# Patient Record
Sex: Female | Born: 1945 | Race: Black or African American | Hispanic: No | Smoking: Former smoker
Health system: Southern US, Community
[De-identification: ages and names within clinical notes are randomized; demographics above are authoritative.]

## PROBLEM LIST (undated history)

## (undated) DIAGNOSIS — R0602 Shortness of breath: Secondary | ICD-10-CM

## (undated) DIAGNOSIS — N200 Calculus of kidney: Secondary | ICD-10-CM

## (undated) DIAGNOSIS — M1990 Unspecified osteoarthritis, unspecified site: Secondary | ICD-10-CM

## (undated) DIAGNOSIS — I10 Essential (primary) hypertension: Secondary | ICD-10-CM

## (undated) DIAGNOSIS — R000 Tachycardia, unspecified: Secondary | ICD-10-CM

## (undated) DIAGNOSIS — K759 Inflammatory liver disease, unspecified: Secondary | ICD-10-CM

## (undated) DIAGNOSIS — J189 Pneumonia, unspecified organism: Secondary | ICD-10-CM

## (undated) HISTORY — PX: CYST EXCISION: SHX5701

## (undated) HISTORY — PX: TONSILLECTOMY: SUR1361

## (undated) HISTORY — PX: COLONOSCOPY W/ BIOPSIES AND POLYPECTOMY: SHX1376

## (undated) HISTORY — PX: BREAST SURGERY: SHX581

## (undated) HISTORY — PX: JOINT REPLACEMENT: SHX530

## (undated) HISTORY — PX: APPENDECTOMY: SHX54

## (undated) HISTORY — PX: ABDOMINAL HYSTERECTOMY: SHX81

## (undated) HISTORY — PX: FRACTURE SURGERY: SHX138

---

## 2000-05-11 ENCOUNTER — Ambulatory Visit (HOSPITAL_COMMUNITY): Admission: RE | Admit: 2000-05-11 | Discharge: 2000-05-11 | Payer: Self-pay | Admitting: Family Medicine

## 2000-05-11 ENCOUNTER — Encounter: Payer: Self-pay | Admitting: Family Medicine

## 2001-03-29 ENCOUNTER — Ambulatory Visit (HOSPITAL_COMMUNITY): Admission: RE | Admit: 2001-03-29 | Discharge: 2001-03-29 | Payer: Self-pay | Admitting: Family Medicine

## 2002-11-23 ENCOUNTER — Emergency Department (HOSPITAL_COMMUNITY): Admission: EM | Admit: 2002-11-23 | Discharge: 2002-11-23 | Payer: Self-pay | Admitting: Emergency Medicine

## 2012-08-12 ENCOUNTER — Emergency Department (HOSPITAL_BASED_OUTPATIENT_CLINIC_OR_DEPARTMENT_OTHER)
Admission: EM | Admit: 2012-08-12 | Discharge: 2012-08-12 | Disposition: A | Discharge status: Home / Self Care | Payer: Medicare Other | Attending: Emergency Medicine | Admitting: Emergency Medicine

## 2012-08-12 ENCOUNTER — Emergency Department (HOSPITAL_BASED_OUTPATIENT_CLINIC_OR_DEPARTMENT_OTHER): Payer: Medicare Other

## 2012-08-12 ENCOUNTER — Encounter (HOSPITAL_BASED_OUTPATIENT_CLINIC_OR_DEPARTMENT_OTHER): Payer: Self-pay | Admitting: *Deleted

## 2012-08-12 DIAGNOSIS — R0789 Other chest pain: Secondary | ICD-10-CM | POA: Insufficient documentation

## 2012-08-12 DIAGNOSIS — M549 Dorsalgia, unspecified: Secondary | ICD-10-CM | POA: Insufficient documentation

## 2012-08-12 DIAGNOSIS — I10 Essential (primary) hypertension: Secondary | ICD-10-CM | POA: Insufficient documentation

## 2012-08-12 DIAGNOSIS — J029 Acute pharyngitis, unspecified: Secondary | ICD-10-CM | POA: Insufficient documentation

## 2012-08-12 DIAGNOSIS — R51 Headache: Secondary | ICD-10-CM | POA: Insufficient documentation

## 2012-08-12 DIAGNOSIS — Z79899 Other long term (current) drug therapy: Secondary | ICD-10-CM | POA: Insufficient documentation

## 2012-08-12 HISTORY — DX: Essential (primary) hypertension: I10

## 2012-08-12 LAB — COMPREHENSIVE METABOLIC PANEL
AST: 35 U/L (ref 0–37)
Albumin: 3.4 g/dL — ABNORMAL LOW (ref 3.5–5.2)
Alkaline Phosphatase: 157 U/L — ABNORMAL HIGH (ref 39–117)
BUN: 12 mg/dL (ref 6–23)
Chloride: 105 mEq/L (ref 96–112)
Potassium: 3.7 mEq/L (ref 3.5–5.1)
Total Bilirubin: 0.4 mg/dL (ref 0.3–1.2)

## 2012-08-12 LAB — URINALYSIS, ROUTINE W REFLEX MICROSCOPIC
Bilirubin Urine: NEGATIVE
Glucose, UA: NEGATIVE mg/dL
Hgb urine dipstick: NEGATIVE
Ketones, ur: NEGATIVE mg/dL
Protein, ur: NEGATIVE mg/dL

## 2012-08-12 LAB — CBC WITH DIFFERENTIAL/PLATELET
Basophils Absolute: 0 10*3/uL (ref 0.0–0.1)
Basophils Relative: 0 % (ref 0–1)
Hemoglobin: 13.4 g/dL (ref 12.0–15.0)
Lymphocytes Relative: 28 % (ref 12–46)
MCHC: 35.1 g/dL (ref 30.0–36.0)
Monocytes Relative: 7 % (ref 3–12)
Neutro Abs: 3 10*3/uL (ref 1.7–7.7)
Neutrophils Relative %: 62 % (ref 43–77)
WBC: 4.8 10*3/uL (ref 4.0–10.5)

## 2012-08-12 LAB — URINE MICROSCOPIC-ADD ON

## 2012-08-12 MED ORDER — TRAMADOL HCL 50 MG PO TABS: 50.0000 mg | ORAL_TABLET | Freq: Four times a day (QID) | ORAL | Status: DC | PRN

## 2012-08-12 MED ORDER — TRAMADOL HCL 50 MG PO TABS
50.0000 mg | ORAL_TABLET | Freq: Once | ORAL | Status: AC
  Administered 2012-08-12: 50 mg via ORAL
  Filled 2012-08-12: qty 1

## 2012-08-12 MED ORDER — IBUPROFEN 600 MG PO TABS: 600.0000 mg | ORAL_TABLET | Freq: Four times a day (QID) | ORAL | Status: DC | PRN

## 2012-08-12 MED ORDER — IBUPROFEN 400 MG PO TABS
600.0000 mg | ORAL_TABLET | Freq: Once | ORAL | Status: AC
  Administered 2012-08-12: 600 mg via ORAL
  Filled 2012-08-12: qty 1

## 2012-08-12 MED ORDER — IOHEXOL 350 MG/ML SOLN
100.0000 mL | Freq: Once | INTRAVENOUS | Status: AC | PRN
  Administered 2012-08-12: 100 mL via INTRAVENOUS

## 2012-08-12 NOTE — ED Provider Notes (Signed)
History     CSN: [id number]  Arrival date & time 08/12/12  1018   First MD Initiated Contact with Patient 08/12/12 1056      Chief Complaint  Patient presents with  . Headache    (Consider location/radiation/quality/duration/timing/severity/associated sxs/prior treatment) HPI Pt had L sided sharp chest pain starting last night. No radiation. No SOB, cough, lower ext swelling. Pt also c/o generalized HA. No focal weakness, sensory changes, N/V. Pt is poor historian and describes symptoms vaguely  Past Medical History  Diagnosis Date  . Hypertension     Past Surgical History  Procedure Date  . Joint replacement   . Abdominal hysterectomy   . Tonsillectomy     No family history on file.  History  Substance Use Topics  . Smoking status: Never Smoker   . Smokeless tobacco: Not on file  . Alcohol Use: No    OB History    Grav Para Term Preterm Abortions TAB SAB Ect Mult Living                  Review of Systems  Constitutional: Negative for fever and chills.  HENT: Positive for sore throat. Negative for congestion, rhinorrhea, neck pain, voice change and sinus pressure.   Eyes: Negative for photophobia and visual disturbance.  Respiratory: Negative for cough, shortness of breath and wheezing.   Cardiovascular: Positive for chest pain. Negative for palpitations and leg swelling.  Gastrointestinal: Negative for nausea, vomiting, diarrhea and anal bleeding.  Musculoskeletal: Negative for back pain.  Neurological: Positive for headaches. Negative for dizziness, seizures, syncope, weakness, light-headedness and numbness.    Allergies  Review of patient's allergies indicates no known allergies.  Home Medications   Current Outpatient Rx  Name Route Sig Dispense Refill  . ALLOPURINOL 300 MG PO TABS Oral Take 300 mg by mouth daily.    Marland Kitchen DILTIAZEM HCL ER BEADS 240 MG PO CP24 Oral Take 240 mg by mouth daily.    Marland Kitchen LOSARTAN POTASSIUM 100 MG PO TABS Oral Take 100 mg by  mouth daily.    . IBUPROFEN 600 MG PO TABS Oral Take 1 tablet (600 mg total) by mouth every 6 (six) hours as needed for pain. 30 tablet 0  . TRAMADOL HCL 50 MG PO TABS Oral Take 1 tablet (50 mg total) by mouth every 6 (six) hours as needed for pain. 15 tablet 0    BP 169/87  Pulse 69  Temp 98 F (36.7 C) (Oral)  Resp 16  Ht 5\' 6"  (1.676 m)  Wt 275 lb (124.739 kg)  BMI 44.39 kg/m2  SpO2 95%  Physical Exam  Nursing note and vitals reviewed. Constitutional: She is oriented to person, place, and time. She appears well-developed and well-nourished. No distress.  HENT:  Head: Normocephalic and atraumatic.  Mouth/Throat: Oropharynx is clear and moist.  Eyes: EOM are normal. Pupils are equal, round, and reactive to light.  Neck: Normal range of motion. Neck supple.  Cardiovascular: Normal rate and regular rhythm.  Exam reveals no gallop and no friction rub.   No murmur heard. Pulmonary/Chest: Effort normal and breath sounds normal. No respiratory distress. She has no wheezes. She has no rales. She exhibits tenderness (mild L chest wall ttp).  Abdominal: Soft. Bowel sounds are normal. She exhibits no distension and no mass. There is no tenderness. There is no rebound and no guarding.  Musculoskeletal: Normal range of motion. She exhibits no edema and no tenderness.       L calf  swelling. No TTP  Neurological: She is alert and oriented to person, place, and time.       5/5 motor, sensation intact  Skin: Skin is warm and dry. No rash noted. No erythema.  Psychiatric: She has a normal mood and affect. Her behavior is normal.    ED Course  Procedures (including critical care time)  Labs Reviewed  COMPREHENSIVE METABOLIC PANEL - Abnormal; Notable for the following:    Albumin 3.4 (*)     ALT 53 (*)     Alkaline Phosphatase 157 (*)     GFR calc non Af Amer 52 (*)     GFR calc Af Amer 60 (*)     All other components within normal limits  URINALYSIS, ROUTINE W REFLEX MICROSCOPIC -  Abnormal; Notable for the following:    Leukocytes, UA TRACE (*)     All other components within normal limits  D-DIMER, QUANTITATIVE - Abnormal; Notable for the following:    D-Dimer, Quant 0.92 (*)     All other components within normal limits  URINE MICROSCOPIC-ADD ON - Abnormal; Notable for the following:    Squamous Epithelial / LPF FEW (*)     Bacteria, UA FEW (*)     All other components within normal limits  CBC WITH DIFFERENTIAL  TROPONIN I  TROPONIN I   Dg Chest 2 View  08/12/2012  *RADIOLOGY REPORT*  Clinical Data: Left-sided chest pain radiating to the back. Headache and nausea.  Hypertension.  CHEST - 2 VIEW  Comparison: Chest radiograph from [facility] dated 07/12/2011.  Findings: Borderline cardiomegaly noted with mild tortuosity of the thoracic aorta.  Linear scarring noted in both lower lobes and in the left upper lobe. The lungs appear otherwise clear.  Mild thoracic spondylosis noted.  No pleural effusion.  IMPRESSION:  1.  Borderline cardiomegaly. 2.  Tortuous thoracic aorta ( stable). 3.  Linear scarring in the lower lobes and left upper lobe.   Original Report Authenticated By: Dellia Cloud, M.D.    Ct Angio Chest Pe W/cm &/or Wo Cm  08/12/2012  *RADIOLOGY REPORT*  Clinical Data: Midsternal chest pain and pressure.  Shortness of breath.  Elevated D-dimer.  CT ANGIOGRAPHY CHEST  Technique:  Multidetector CT imaging of the chest using the standard protocol during bolus administration of intravenous contrast. Multiplanar reconstructed images including MIPs were obtained and reviewed to evaluate the vascular anatomy.  Contrast: OMNIPAQUE IOHEXOL 350 MG/ML SOLN  Comparison: 08/12/2012  Findings: No filling defect is identified in the pulmonary arterial tree to suggest pulmonary embolus.  No acute thoracic aortic findings.  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  No pathologic thoracic adenopathy.  No reversal of the interventricular septal contour.   Tortuous thoracic aorta noted.  No pleural effusion identified.  Linear scarring noted in the lower lobes, right middle lobe, lingula.  IMPRESSION:  1. No filling defect is identified in the pulmonary arterial tree to suggest pulmonary embolus. 2.  Linear scarring in the lower lobes, right middle lobe, and lingula. 3.  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 4.  Thoracic spondylosis. 5.  Tortuous aorta.   Original Report Authenticated By: Dellia Cloud, M.D.      1. Atypical chest pain   2. Headache      Date: 08/12/2012  Rate:63  Rhythm: normal sinus rhythm  QRS Axis: normal  Intervals: normal  ST/T Wave abnormalities: normal  Conduction Disutrbances:none  Narrative Interpretation:   Old  EKG Reviewed: none available    MDM  Atypical chest pain. Given swelling in L calf will screen for PE.   CT with no cause for pt's symptoms. She is currently asymptomatic. Doubt CAD. I have urged pt to f/u with her pMD and return immediately for any change in symptoms or concerns     Loren Racer, MD 08/12/12 [phone]

## 2012-08-12 NOTE — ED Notes (Signed)
Pt presents to ED today with HA and mild sinus pressure.  Pt also reports some mid chest pressure.  No cough or URI sx.

## 2013-05-14 ENCOUNTER — Other Ambulatory Visit: Payer: Self-pay | Admitting: Orthopaedic Surgery

## 2013-05-14 ENCOUNTER — Other Ambulatory Visit: Payer: Medicare Other

## 2013-05-14 DIAGNOSIS — R609 Edema, unspecified: Secondary | ICD-10-CM

## 2013-05-14 DIAGNOSIS — R52 Pain, unspecified: Secondary | ICD-10-CM

## 2013-05-15 ENCOUNTER — Ambulatory Visit
Admission: RE | Admit: 2013-05-15 | Discharge: 2013-05-15 | Disposition: A | Discharge status: Home / Self Care | Payer: Medicare Other | Source: Ambulatory Visit | Attending: Orthopaedic Surgery | Admitting: Orthopaedic Surgery

## 2013-05-15 DIAGNOSIS — R52 Pain, unspecified: Secondary | ICD-10-CM

## 2013-05-15 DIAGNOSIS — R609 Edema, unspecified: Secondary | ICD-10-CM

## 2013-06-20 ENCOUNTER — Other Ambulatory Visit (HOSPITAL_COMMUNITY): Payer: Self-pay | Admitting: Orthopaedic Surgery

## 2013-07-09 ENCOUNTER — Encounter (HOSPITAL_COMMUNITY): Payer: Self-pay

## 2013-07-11 ENCOUNTER — Encounter (HOSPITAL_COMMUNITY)
Admission: RE | Admit: 2013-07-11 | Discharge: 2013-07-11 | Disposition: A | Discharge status: Home / Self Care | Payer: Medicare Other | Source: Ambulatory Visit | Attending: Orthopaedic Surgery | Admitting: Orthopaedic Surgery

## 2013-07-11 ENCOUNTER — Encounter (HOSPITAL_COMMUNITY): Payer: Self-pay

## 2013-07-11 DIAGNOSIS — Z01818 Encounter for other preprocedural examination: Secondary | ICD-10-CM | POA: Insufficient documentation

## 2013-07-11 DIAGNOSIS — Z01812 Encounter for preprocedural laboratory examination: Secondary | ICD-10-CM | POA: Insufficient documentation

## 2013-07-11 HISTORY — DX: Shortness of breath: R06.02

## 2013-07-11 HISTORY — DX: Pneumonia, unspecified organism: J18.9

## 2013-07-11 HISTORY — DX: Inflammatory liver disease, unspecified: K75.9

## 2013-07-11 HISTORY — DX: Calculus of kidney: N20.0

## 2013-07-11 HISTORY — DX: Tachycardia, unspecified: R00.0

## 2013-07-11 HISTORY — DX: Unspecified osteoarthritis, unspecified site: M19.90

## 2013-07-11 LAB — COMPREHENSIVE METABOLIC PANEL
ALT: 46 U/L — ABNORMAL HIGH (ref 0–35)
Alkaline Phosphatase: 168 U/L — ABNORMAL HIGH (ref 39–117)
BUN: 11 mg/dL (ref 6–23)
Chloride: 104 mEq/L (ref 96–112)
GFR calc Af Amer: 66 mL/min — ABNORMAL LOW (ref >90)
Glucose, Bld: 95 mg/dL (ref 70–99)
Potassium: 3.8 mEq/L (ref 3.5–5.1)
Sodium: 140 mEq/L (ref 135–145)
Total Bilirubin: 0.6 mg/dL (ref 0.3–1.2)
Total Protein: 7.8 g/dL (ref 6.0–8.3)

## 2013-07-11 LAB — URINALYSIS, ROUTINE W REFLEX MICROSCOPIC
Bilirubin Urine: NEGATIVE
Glucose, UA: NEGATIVE mg/dL
Hgb urine dipstick: NEGATIVE
Protein, ur: 30 mg/dL — AB
Specific Gravity, Urine: 1.015 (ref 1.005–1.030)

## 2013-07-11 LAB — URINE MICROSCOPIC-ADD ON

## 2013-07-11 LAB — APTT: aPTT: 31 seconds (ref 24–37)

## 2013-07-11 LAB — CBC
HCT: 42.9 % (ref 36.0–46.0)
Hemoglobin: 15.6 g/dL — ABNORMAL HIGH (ref 12.0–15.0)
MCHC: 36.4 g/dL — ABNORMAL HIGH (ref 30.0–36.0)
RBC: 5.34 MIL/uL — ABNORMAL HIGH (ref 3.87–5.11)
WBC: 4.3 10*3/uL (ref 4.0–10.5)

## 2013-07-11 LAB — PROTIME-INR: Prothrombin Time: 12.7 seconds (ref 11.6–15.2)

## 2013-07-11 LAB — TYPE AND SCREEN
ABO/RH(D): B POS
Antibody Screen: NEGATIVE

## 2013-07-11 LAB — ABO/RH: ABO/RH(D): B POS

## 2013-07-11 NOTE — Progress Notes (Signed)
Pt denies chest pain but states that she gets SOB with exertion. Pt is under the care of Dr. Dalene Carrow (cardiology) and was recently referred to Dr. Bascom Levels ( cardiology) for "high heart rate" both of [facility] Pt states that she has cardiac clearance for surgery; according to pt, "Dr. Ophelia Charter has it." pt denies having a cardiac cath but states that she had an echo at [facility] and a stress test at [facility] at [facility]. Results were requested for both. Pt PCP, Dr. Josiah Lobo at [facility] was notified of pt STOP-BANG Assessment Tool results. Pt chart left for [place], [place] ( anesthesia) to review chest x ray and cardiac history.

## 2013-07-11 NOTE — Progress Notes (Addendum)
Charted in error on wrong pt.

## 2013-07-11 NOTE — Pre-Procedure Instructions (Signed)
Julia Tucker  07/11/2013   Your procedure is scheduled on: Wednesday, July 17, 2013  Report to [facility] at 10:30 AM.  Call this number if you have problems the morning of surgery: [phone]   Remember:   Do not eat food or drink liquids after midnight.   Take these medicines the morning of surgery with A SIP OF WATER: allopurinol (ZYLOPRIM) 300 MG tablet, atenolol (TENORMIN) 50 MG tablet, diltiazem (CARDIZEM SR) 120 MG 12 hr capsule   Stop taking Aspirin and herbal medications. Do not take any NSAIDs ie: Ibuprofen, Advil, Naproxen or any medication containing Aspirin.  Do not wear jewelry, make-up or nail polish.  Do not wear lotions, powders, or perfumes. You may wear deodorant.  Do not shave 48 hours prior to surgery.  Do not bring valuables to the hospital.  [facility] is not responsible for any belongings or valuables.  Contacts, dentures or bridgework may not be worn into surgery.  Leave suitcase in the car. After surgery it may be brought to your room.  For patients admitted to the hospital, checkout time is 11:00 AM the day of discharge.   Patients discharged the day of surgery will not be allowed to drive home.  Name and phone number of your driver:   Special Instructions: Shower using CHG 2 nights before surgery and the night before surgery.  If you shower the day of surgery use CHG.  Use special wash - you have one bottle of CHG for all showers.  You should use approximately 1/3 of the bottle for each shower.   Please read over the following fact sheets that you were given: Pain Booklet, Coughing and Deep Breathing, Blood Transfusion Information, Total Joint Packet, MRSA Information and Surgical Site Infection Prevention

## 2013-07-12 ENCOUNTER — Encounter (HOSPITAL_COMMUNITY): Payer: Self-pay

## 2013-07-12 NOTE — Progress Notes (Signed)
Anesthesia Chart Review:  Patient is a 67 year old female scheduled for left TKA on 07/17/13 by Dr. Ophelia Charter.  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   Patient's primary cardiologist is Dr. Dot Been who felt she was acceptable risk for surgery.  She was also seen by EP cardiologist Dr. Clydie Braun on 06/11/13 for a preoperative evaluation.  She was cleared for surgery with instructions to take diltiazem on the morning of surgery and restart it and atenolol as soon as able post-operatively.    EKG on 06/11/13 ([facility]) showed SB @ 58 bpm, LAE.  Stress echo on 09/25/12 ([facility]) showed normal resting biventricular function (ejection fraction > 50%), with no resting segmental abnormality, no clinical or echocardiographic ischemia (induced all motion abnormality), negative stress echocardiogram.  2V CXR on 08/12/12 showed: 1. Borderline cardiomegaly.  2. Tortuous thoracic aorta ( stable).  3. Linear scarring in the lower lobes and left upper lobe.  She had a 1V CXR on 09/23/12 ([facility]) showed no active disease of the chest.   Preoperative labs noted.  If no acute changes then I would anticipate that she could proceed as planned.  Velna Ochs [facility] Stay Center/Anesthesiology Phone [phone] 07/12/2013 12:38 PM

## 2013-07-16 MED ORDER — DEXTROSE 5 % IV SOLN
3.0000 g | INTRAVENOUS | Status: AC
  Administered 2013-07-17: 3 g via INTRAVENOUS
  Filled 2013-07-16: qty 3000

## 2013-07-17 ENCOUNTER — Encounter (HOSPITAL_COMMUNITY)
Admission: RE | Disposition: A | Discharge status: Home Health | Payer: Self-pay | Source: Ambulatory Visit | Attending: Orthopaedic Surgery

## 2013-07-17 ENCOUNTER — Encounter (HOSPITAL_COMMUNITY): Payer: Self-pay | Admitting: Anesthesiology

## 2013-07-17 ENCOUNTER — Inpatient Hospital Stay (HOSPITAL_COMMUNITY): Payer: Medicare Other | Admitting: Anesthesiology

## 2013-07-17 ENCOUNTER — Encounter (HOSPITAL_COMMUNITY): Payer: Self-pay | Admitting: Vascular Surgery

## 2013-07-17 ENCOUNTER — Inpatient Hospital Stay (HOSPITAL_COMMUNITY)
Admission: RE | Admit: 2013-07-17 | Discharge: 2013-07-20 | DRG: 470 | Disposition: A | Discharge status: Home Health | Payer: Medicare Other | Source: Ambulatory Visit | Attending: Orthopaedic Surgery | Admitting: Orthopaedic Surgery

## 2013-07-17 DIAGNOSIS — N289 Disorder of kidney and ureter, unspecified: Secondary | ICD-10-CM

## 2013-07-17 DIAGNOSIS — Z8249 Family history of ischemic heart disease and other diseases of the circulatory system: Secondary | ICD-10-CM

## 2013-07-17 DIAGNOSIS — Z801 Family history of malignant neoplasm of trachea, bronchus and lung: Secondary | ICD-10-CM

## 2013-07-17 DIAGNOSIS — B1920 Unspecified viral hepatitis C without hepatic coma: Secondary | ICD-10-CM | POA: Diagnosis present

## 2013-07-17 DIAGNOSIS — M1712 Unilateral primary osteoarthritis, left knee: Secondary | ICD-10-CM | POA: Diagnosis present

## 2013-07-17 DIAGNOSIS — M24569 Contracture, unspecified knee: Secondary | ICD-10-CM | POA: Diagnosis present

## 2013-07-17 DIAGNOSIS — Z87891 Personal history of nicotine dependence: Secondary | ICD-10-CM

## 2013-07-17 DIAGNOSIS — Z23 Encounter for immunization: Secondary | ICD-10-CM

## 2013-07-17 DIAGNOSIS — Z9089 Acquired absence of other organs: Secondary | ICD-10-CM

## 2013-07-17 DIAGNOSIS — Z96659 Presence of unspecified artificial knee joint: Secondary | ICD-10-CM

## 2013-07-17 DIAGNOSIS — M1710 Unilateral primary osteoarthritis, unspecified knee: Principal | ICD-10-CM | POA: Diagnosis present

## 2013-07-17 DIAGNOSIS — I10 Essential (primary) hypertension: Secondary | ICD-10-CM | POA: Diagnosis present

## 2013-07-17 DIAGNOSIS — R339 Retention of urine, unspecified: Secondary | ICD-10-CM | POA: Diagnosis not present

## 2013-07-17 HISTORY — PX: KNEE ARTHROPLASTY: SHX992

## 2013-07-17 SURGERY — ARTHROPLASTY, KNEE, TOTAL, USING IMAGELESS COMPUTER-ASSISTED NAVIGATION
Anesthesia: Regional | Site: Knee | Laterality: Left | Wound class: Clean

## 2013-07-17 MED ORDER — MIDAZOLAM HCL 2 MG/2ML IJ SOLN
1.0000 mg | INTRAMUSCULAR | Status: DC | PRN
  Administered 2013-07-17: 2 mg via INTRAVENOUS

## 2013-07-17 MED ORDER — METOCLOPRAMIDE HCL 10 MG PO TABS
5.0000 mg | ORAL_TABLET | Freq: Three times a day (TID) | ORAL | Status: DC | PRN
Start: 2013-07-17 — End: 2013-07-20

## 2013-07-17 MED ORDER — LACTATED RINGERS IV SOLN
INTRAVENOUS | Status: DC | PRN
  Administered 2013-07-17 (×2): via INTRAVENOUS

## 2013-07-17 MED ORDER — OXYCODONE HCL 5 MG PO TABS
5.0000 mg | ORAL_TABLET | ORAL | Status: DC | PRN
  Administered 2013-07-17: 10 mg via ORAL
  Administered 2013-07-19 (×2): 5 mg via ORAL
  Administered 2013-07-19: 10 mg via ORAL
  Filled 2013-07-17 (×2): qty 2
  Filled 2013-07-17 (×2): qty 1
  Filled 2013-07-17: qty 2

## 2013-07-17 MED ORDER — SODIUM CHLORIDE 0.9 % IR SOLN
Status: DC | PRN
  Administered 2013-07-17: 3000 mL

## 2013-07-17 MED ORDER — ATORVASTATIN CALCIUM 10 MG PO TABS
10.0000 mg | ORAL_TABLET | Freq: Every day | ORAL | Status: DC
  Filled 2013-07-17 (×3): qty 1

## 2013-07-17 MED ORDER — HYDROMORPHONE HCL PF 1 MG/ML IJ SOLN
INTRAMUSCULAR | Status: AC
  Filled 2013-07-17: qty 1

## 2013-07-17 MED ORDER — ONDANSETRON HCL 4 MG/2ML IJ SOLN
INTRAMUSCULAR | Status: DC | PRN
  Administered 2013-07-17: 4 mg via INTRAVENOUS

## 2013-07-17 MED ORDER — PHENOL 1.4 % MT LIQD: 1.0000 | OROMUCOSAL | Status: DC | PRN

## 2013-07-17 MED ORDER — HYDROMORPHONE HCL PF 1 MG/ML IJ SOLN
0.2500 mg | INTRAMUSCULAR | Status: DC | PRN
  Administered 2013-07-17 (×2): 0.5 mg via INTRAVENOUS

## 2013-07-17 MED ORDER — OXYCODONE HCL 5 MG/5ML PO SOLN: 5.0000 mg | Freq: Once | ORAL | Status: DC | PRN

## 2013-07-17 MED ORDER — ATENOLOL 25 MG PO TABS
25.0000 mg | ORAL_TABLET | Freq: Every day | ORAL | Status: DC
  Administered 2013-07-19 – 2013-07-20 (×2): 25 mg via ORAL
  Filled 2013-07-17 (×3): qty 1

## 2013-07-17 MED ORDER — FENTANYL CITRATE 0.05 MG/ML IJ SOLN
50.0000 ug | INTRAMUSCULAR | Status: DC | PRN
  Administered 2013-07-17: 100 ug via INTRAVENOUS

## 2013-07-17 MED ORDER — MORPHINE SULFATE 2 MG/ML IJ SOLN: 2.0000 mg | INTRAMUSCULAR | Status: DC | PRN

## 2013-07-17 MED ORDER — FENTANYL CITRATE 0.05 MG/ML IJ SOLN
INTRAMUSCULAR | Status: AC
  Filled 2013-07-17: qty 2

## 2013-07-17 MED ORDER — FENTANYL CITRATE 0.05 MG/ML IJ SOLN: 50.0000 ug | INTRAMUSCULAR | Status: DC | PRN

## 2013-07-17 MED ORDER — ACETAMINOPHEN 325 MG PO TABS
650.0000 mg | ORAL_TABLET | Freq: Four times a day (QID) | ORAL | Status: DC | PRN
  Administered 2013-07-19 (×2): 650 mg via ORAL
  Filled 2013-07-17 (×2): qty 2

## 2013-07-17 MED ORDER — BUPIVACAINE-EPINEPHRINE PF 0.5-1:200000 % IJ SOLN
INTRAMUSCULAR | Status: DC | PRN
  Administered 2013-07-17: 30 mL

## 2013-07-17 MED ORDER — NEOSTIGMINE METHYLSULFATE 1 MG/ML IJ SOLN
INTRAMUSCULAR | Status: DC | PRN
  Administered 2013-07-17: 3 mg via INTRAVENOUS

## 2013-07-17 MED ORDER — ONDANSETRON HCL 4 MG/2ML IJ SOLN
4.0000 mg | Freq: Four times a day (QID) | INTRAMUSCULAR | Status: DC | PRN
  Administered 2013-07-18: 4 mg via INTRAVENOUS
  Filled 2013-07-17: qty 2

## 2013-07-17 MED ORDER — MENTHOL 3 MG MT LOZG: 1.0000 | LOZENGE | OROMUCOSAL | Status: DC | PRN

## 2013-07-17 MED ORDER — METHOCARBAMOL 500 MG PO TABS
500.0000 mg | ORAL_TABLET | Freq: Four times a day (QID) | ORAL | Status: DC | PRN
  Administered 2013-07-19: 500 mg via ORAL
  Filled 2013-07-17: qty 1

## 2013-07-17 MED ORDER — LACTATED RINGERS IV SOLN
INTRAVENOUS | Status: DC
  Administered 2013-07-17: 12:00:00 via INTRAVENOUS

## 2013-07-17 MED ORDER — FLEET ENEMA 7-19 GM/118ML RE ENEM: 1.0000 | ENEMA | Freq: Once | RECTAL | Status: AC | PRN

## 2013-07-17 MED ORDER — FENTANYL CITRATE 0.05 MG/ML IJ SOLN
INTRAMUSCULAR | Status: DC | PRN
  Administered 2013-07-17: 50 ug via INTRAVENOUS
  Administered 2013-07-17: 100 ug via INTRAVENOUS
  Administered 2013-07-17: 25 ug via INTRAVENOUS
  Administered 2013-07-17: 50 ug via INTRAVENOUS
  Administered 2013-07-17: 25 ug via INTRAVENOUS
  Administered 2013-07-17: 100 ug via INTRAVENOUS

## 2013-07-17 MED ORDER — ALLOPURINOL 300 MG PO TABS
300.0000 mg | ORAL_TABLET | Freq: Every day | ORAL | Status: DC
  Administered 2013-07-18 – 2013-07-20 (×3): 300 mg via ORAL
  Filled 2013-07-17 (×3): qty 1

## 2013-07-17 MED ORDER — ROCURONIUM BROMIDE 100 MG/10ML IV SOLN
INTRAVENOUS | Status: DC | PRN
  Administered 2013-07-17: 50 mg via INTRAVENOUS

## 2013-07-17 MED ORDER — METOCLOPRAMIDE HCL 5 MG/ML IJ SOLN: 5.0000 mg | Freq: Three times a day (TID) | INTRAMUSCULAR | Status: DC | PRN

## 2013-07-17 MED ORDER — DOCUSATE SODIUM 100 MG PO CAPS
100.0000 mg | ORAL_CAPSULE | Freq: Two times a day (BID) | ORAL | Status: DC
  Administered 2013-07-17 – 2013-07-20 (×6): 100 mg via ORAL
  Filled 2013-07-17 (×6): qty 1

## 2013-07-17 MED ORDER — LIDOCAINE HCL (CARDIAC) 20 MG/ML IV SOLN
INTRAVENOUS | Status: DC | PRN
  Administered 2013-07-17: 70 mg via INTRAVENOUS

## 2013-07-17 MED ORDER — METHOCARBAMOL 100 MG/ML IJ SOLN
500.0000 mg | Freq: Four times a day (QID) | INTRAVENOUS | Status: DC | PRN
  Filled 2013-07-17: qty 5

## 2013-07-17 MED ORDER — DILTIAZEM HCL ER 60 MG PO CP12
120.0000 mg | ORAL_CAPSULE | Freq: Two times a day (BID) | ORAL | Status: DC
  Administered 2013-07-18 – 2013-07-20 (×4): 120 mg via ORAL
  Filled 2013-07-17 (×7): qty 2

## 2013-07-17 MED ORDER — MIDAZOLAM HCL 2 MG/2ML IJ SOLN
INTRAMUSCULAR | Status: AC
  Filled 2013-07-17: qty 2

## 2013-07-17 MED ORDER — GLYCOPYRROLATE 0.2 MG/ML IJ SOLN
INTRAMUSCULAR | Status: DC | PRN
  Administered 2013-07-17: 0.2 mg via INTRAVENOUS
  Administered 2013-07-17: 0.4 mg via INTRAVENOUS

## 2013-07-17 MED ORDER — SENNOSIDES-DOCUSATE SODIUM 8.6-50 MG PO TABS
1.0000 | ORAL_TABLET | Freq: Every evening | ORAL | Status: DC | PRN
  Administered 2013-07-19: 1 via ORAL
  Filled 2013-07-17: qty 1

## 2013-07-17 MED ORDER — BISACODYL 10 MG RE SUPP: 10.0000 mg | Freq: Every day | RECTAL | Status: DC | PRN

## 2013-07-17 MED ORDER — OXYCODONE-ACETAMINOPHEN 5-325 MG PO TABS: 1.0000 | ORAL_TABLET | ORAL | Status: DC | PRN

## 2013-07-17 MED ORDER — METHOCARBAMOL 500 MG PO TABS: 500.0000 mg | ORAL_TABLET | Freq: Four times a day (QID) | ORAL | Status: DC | PRN

## 2013-07-17 MED ORDER — ONDANSETRON HCL 4 MG PO TABS: 4.0000 mg | ORAL_TABLET | Freq: Four times a day (QID) | ORAL | Status: DC | PRN

## 2013-07-17 MED ORDER — OXYCODONE HCL 5 MG PO TABS: 5.0000 mg | ORAL_TABLET | Freq: Once | ORAL | Status: DC | PRN

## 2013-07-17 MED ORDER — PROPOFOL 10 MG/ML IV BOLUS
INTRAVENOUS | Status: DC | PRN
  Administered 2013-07-17: 160 mg via INTRAVENOUS

## 2013-07-17 MED ORDER — ASPIRIN EC 325 MG PO TBEC
325.0000 mg | DELAYED_RELEASE_TABLET | Freq: Every day | ORAL | Status: DC
  Administered 2013-07-18 – 2013-07-20 (×3): 325 mg via ORAL
  Filled 2013-07-17 (×4): qty 1

## 2013-07-17 MED ORDER — ONDANSETRON HCL 4 MG/2ML IJ SOLN: 4.0000 mg | Freq: Four times a day (QID) | INTRAMUSCULAR | Status: DC | PRN

## 2013-07-17 MED ORDER — KCL IN DEXTROSE-NACL 20-5-0.45 MEQ/L-%-% IV SOLN
INTRAVENOUS | Status: DC
  Administered 2013-07-17: 75 mL/h via INTRAVENOUS
  Filled 2013-07-17 (×3): qty 1000

## 2013-07-17 MED ORDER — FUROSEMIDE 40 MG PO TABS
40.0000 mg | ORAL_TABLET | Freq: Every morning | ORAL | Status: DC
  Administered 2013-07-18 – 2013-07-20 (×3): 40 mg via ORAL
  Filled 2013-07-17 (×3): qty 1

## 2013-07-17 MED ORDER — ACETAMINOPHEN 650 MG RE SUPP: 650.0000 mg | Freq: Four times a day (QID) | RECTAL | Status: DC | PRN

## 2013-07-17 MED ORDER — ASPIRIN EC 325 MG PO TBEC: 325.0000 mg | DELAYED_RELEASE_TABLET | Freq: Every day | ORAL | Status: DC

## 2013-07-17 MED ORDER — ZOLPIDEM TARTRATE 5 MG PO TABS: 5.0000 mg | ORAL_TABLET | Freq: Every evening | ORAL | Status: DC | PRN

## 2013-07-17 MED ORDER — LOSARTAN POTASSIUM 50 MG PO TABS
100.0000 mg | ORAL_TABLET | Freq: Every day | ORAL | Status: DC
  Administered 2013-07-18 – 2013-07-20 (×3): 100 mg via ORAL
  Filled 2013-07-17 (×4): qty 2

## 2013-07-17 MED ORDER — MIDAZOLAM HCL 2 MG/2ML IJ SOLN: 1.0000 mg | INTRAMUSCULAR | Status: DC | PRN

## 2013-07-17 MED ORDER — KETOROLAC TROMETHAMINE 15 MG/ML IJ SOLN
15.0000 mg | Freq: Three times a day (TID) | INTRAMUSCULAR | Status: AC
  Administered 2013-07-17 – 2013-07-18 (×3): 15 mg via INTRAVENOUS
  Filled 2013-07-17 (×3): qty 1

## 2013-07-17 SURGICAL SUPPLY — 65 items
BANDAGE ELASTIC 4 VELCRO ST LF (GAUZE/BANDAGES/DRESSINGS) ×2 IMPLANT
BANDAGE ELASTIC 6 VELCRO ST LF (GAUZE/BANDAGES/DRESSINGS) ×2 IMPLANT
BANDAGE ESMARK 6X9 LF (GAUZE/BANDAGES/DRESSINGS) ×1 IMPLANT
BENZOIN TINCTURE PRP APPL 2/3 (GAUZE/BANDAGES/DRESSINGS) ×2 IMPLANT
BLADE SAGITTAL 25.0X1.19X90 (BLADE) ×2 IMPLANT
BLADE SAW SGTL 13X75X1.27 (BLADE) ×2 IMPLANT
BNDG ELASTIC 6X10 VLCR STRL LF (GAUZE/BANDAGES/DRESSINGS) ×2 IMPLANT
BNDG ESMARK 6X9 LF (GAUZE/BANDAGES/DRESSINGS) ×2
BOWL SMART MIX CTS (DISPOSABLE) ×2 IMPLANT
CAPT RP KNEE ×2 IMPLANT
CEMENT HV SMART SET (Cement) ×4 IMPLANT
CLOSURE STERI-STRIP 1/4X4 (GAUZE/BANDAGES/DRESSINGS) ×2 IMPLANT
CLOTH BEACON ORANGE TIMEOUT ST (SAFETY) ×2 IMPLANT
COVER BACK TABLE 24X17X13 BIG (DRAPES) IMPLANT
COVER SURGICAL LIGHT HANDLE (MISCELLANEOUS) ×2 IMPLANT
CUFF TOURNIQUET SINGLE 34IN LL (TOURNIQUET CUFF) ×2 IMPLANT
CUFF TOURNIQUET SINGLE 44IN (TOURNIQUET CUFF) IMPLANT
DRAPE ORTHO SPLIT 77X108 STRL (DRAPES) ×2
DRAPE SURG ORHT 6 SPLT 77X108 (DRAPES) ×2 IMPLANT
DRAPE U-SHAPE 47X51 STRL (DRAPES) ×2 IMPLANT
DRSG PAD ABDOMINAL 8X10 ST (GAUZE/BANDAGES/DRESSINGS) ×2 IMPLANT
DURAPREP 26ML APPLICATOR (WOUND CARE) ×2 IMPLANT
ELECT REM PT RETURN 9FT ADLT (ELECTROSURGICAL) ×2
ELECTRODE REM PT RTRN 9FT ADLT (ELECTROSURGICAL) ×1 IMPLANT
FACESHIELD LNG OPTICON STERILE (SAFETY) ×2 IMPLANT
GAUZE XEROFORM 5X9 LF (GAUZE/BANDAGES/DRESSINGS) ×2 IMPLANT
GLOVE BIOGEL PI IND STRL 7.5 (GLOVE) ×1 IMPLANT
GLOVE BIOGEL PI IND STRL 8 (GLOVE) ×1 IMPLANT
GLOVE BIOGEL PI INDICATOR 7.5 (GLOVE) ×1
GLOVE BIOGEL PI INDICATOR 8 (GLOVE) ×1
GLOVE ECLIPSE 7.0 STRL STRAW (GLOVE) ×2 IMPLANT
GLOVE ORTHO TXT STRL SZ7.5 (GLOVE) ×2 IMPLANT
GOWN PREVENTION PLUS LG XLONG (DISPOSABLE) IMPLANT
GOWN PREVENTION PLUS XLARGE (GOWN DISPOSABLE) ×2 IMPLANT
GOWN STRL NON-REIN LRG LVL3 (GOWN DISPOSABLE) ×6 IMPLANT
HANDPIECE INTERPULSE COAX TIP (DISPOSABLE) ×1
IMMOBILIZER KNEE 22 UNIV (SOFTGOODS) ×2 IMPLANT
KIT BASIN OR (CUSTOM PROCEDURE TRAY) ×2 IMPLANT
KIT ROOM TURNOVER OR (KITS) ×2 IMPLANT
MANIFOLD NEPTUNE II (INSTRUMENTS) ×2 IMPLANT
MARKER SPHERE PSV REFLC THRD 5 (MARKER) ×6 IMPLANT
NEEDLE 1/2 CIR MAYO (NEEDLE) ×2 IMPLANT
NEEDLE HYPO 25GX1X1/2 BEV (NEEDLE) ×2 IMPLANT
NS IRRIG 1000ML POUR BTL (IV SOLUTION) ×2 IMPLANT
PACK TOTAL JOINT (CUSTOM PROCEDURE TRAY) ×2 IMPLANT
PAD ARMBOARD 7.5X6 YLW CONV (MISCELLANEOUS) ×4 IMPLANT
PAD CAST 4YDX4 CTTN HI CHSV (CAST SUPPLIES) ×1 IMPLANT
PADDING CAST COTTON 4X4 STRL (CAST SUPPLIES) ×1
PADDING CAST COTTON 6X4 STRL (CAST SUPPLIES) ×2 IMPLANT
PIN SCHANZ 4MM 130MM (PIN) ×8 IMPLANT
SET HNDPC FAN SPRY TIP SCT (DISPOSABLE) ×1 IMPLANT
SPONGE GAUZE 4X4 12PLY (GAUZE/BANDAGES/DRESSINGS) ×2 IMPLANT
STAPLER VISISTAT 35W (STAPLE) ×2 IMPLANT
STRIP CLOSURE SKIN 1/2X4 (GAUZE/BANDAGES/DRESSINGS) ×4 IMPLANT
SUCTION FRAZIER TIP 10 FR DISP (SUCTIONS) ×2 IMPLANT
SUT ETHIBOND NAB CT1 #1 30IN (SUTURE) ×2 IMPLANT
SUT VIC AB 2-0 CT1 27 (SUTURE) ×2
SUT VIC AB 2-0 CT1 TAPERPNT 27 (SUTURE) ×2 IMPLANT
SUT VICRYL 4-0 PS2 18IN ABS (SUTURE) ×2 IMPLANT
SUT VICRYL AB 2 0 TIES (SUTURE) ×2 IMPLANT
SYR CONTROL 10ML LL (SYRINGE) ×2 IMPLANT
TOWEL OR 17X24 6PK STRL BLUE (TOWEL DISPOSABLE) ×2 IMPLANT
TOWEL OR 17X26 10 PK STRL BLUE (TOWEL DISPOSABLE) ×2 IMPLANT
TRAY FOLEY CATH 16FRSI W/METER (SET/KITS/TRAYS/PACK) ×2 IMPLANT
WATER STERILE IRR 1000ML POUR (IV SOLUTION) ×6 IMPLANT

## 2013-07-17 NOTE — Preoperative (Signed)
Beta Blockers   Reason not to administer Beta Blockers:Not Applicable 

## 2013-07-17 NOTE — Op Note (Signed)
Julia Tucker, Julia Tucker               ACCOUNT NO.:  [id number]  MEDICAL RECORD NO.:  [id number]  LOCATION:  [facility]                         FACILITY:  [facility]  PHYSICIAN:  Mark C. Ophelia Charter, M.D.    DATE OF BIRTH:  06/08/1946  DATE OF PROCEDURE:  07/17/2013 DATE OF DISCHARGE:                              OPERATIVE REPORT   PREOPERATIVE DIAGNOSIS:  Left knee osteoarthritis, tricompartmental grade 4 changes.  POSTOPERATIVE DIAGNOSIS:  Left knee osteoarthritis, tricompartmental grade 4 changes.  PROCEDURE:  Left cemented total knee arthroplasty, computer assist DePuy #4 femur, LCS with lugs, 10 mm rotating platform, #4 tibia cemented, and 41 mm 3 peg polyethylene patellar component.  SURGEON:  Mark C. Ophelia Charter, MD  ASSISTANT:  Wende Neighbors, PA-C, medically necessary and prepped and present for the entire procedure.  ANESTHESIA:  GOT plus preoperative femoral nerve block.  DRAINS:  None.  TOURNIQUET TIME:  117 minutes.  INDICATIONS:  This 67 year old female, 267 pounds, 121 kilos has had progressive knee osteoarthritis with 2 cm first throughout her knee, bone-on-bone changes, and progressive knee flexion deformity and varus deformity with 20 degree knee flexion contracture and 12 degrees varus.  PROCEDURE IN DETAIL:  After induction of general anesthesia with preoperative femoral nerve block, proximal thigh tourniquet was applied over stockinette and usual 15 drape heel bump, lateral post was applied and prepping with DuraPrep, the usual total knee split sheets, drapes, and impervious stockinette, Coban, sterile skin marker, and Betadine, Steri-Drape large x2 used to seal the skin.  Time-out procedure was completed.  Leg was wrapped in Esmarch, tourniquet was inflated to 350 which gave partial good control with some continued oozing throughout the case due to the patient's large leg, body habitus.  Computer pins were inserted inside the incision on the femur and stab incision  made mid tibia.  With some difficulty, femoral head was modulated and tibia and femoral modules were made.  This confirmed the flexion contracture and varus deformity as expected.  10 mm were taken off the distal femur and 9 off the tibia.  A 1-2 cm spurs had to be resected off of the femur and tibia.  Box cut in the femur was made after tibial AP resection for confirmation and placement of the keel.  There was some still medial tightness 2 mm more than lateral and additional spurs had to be removed off of the posterior and medial aspect of the tibial plateau and 3/4 osteotome was used posteriorly to remove some 2 cm osteophytes off the back of the femur.  This gave improvement with good balance in both flexion, extension.  Knee reached full extension.  There was correction of the varus deformity.  Pulsatile lavage was used.  The cement was vacuum mixed.  The tibia cemented first followed by femur, placed with a 10 mm rotating platform and 41 mm cemented polyethylene button.  The cement was hardened 15 minutes.  Tourniquet was deflated.  Hemostasis obtained in standard layered closure, nonabsorbable deep layer where the quad tendon had been split between the medial one-third and lateral two- thirds.  A 2-0 Vicryl in the subcutaneous tissue, stab incisions in the tibia for pin for computer navigation.  A 2-0 on the subcu tissue and 4- 0 subcuticular skin closure.  Tincture of benzoin, Steri-Strips application.  Instrument count and needle count was correct.  The patient tolerated the procedure well and transferred to recovery room in stable condition.  Postoperative dressing and knee immobilizer was applied.  Instrument count and needle count was correct.     Mark C. Ophelia Charter, M.D.     MCY/MEDQ  D:  07/17/2013  T:  07/17/2013  Job:  [id number]

## 2013-07-17 NOTE — Anesthesia Postprocedure Evaluation (Signed)
  Anesthesia Post-op Note  Patient: Julia Tucker  Procedure(s) Performed: Procedure(s) with comments: COMPUTER ASSISTED TOTAL KNEE ARTHROPLASTY (Left) - Left Total Knee Arthroplasty, Cemented, Computer Assist  Patient Location: PACU  Anesthesia Type:General  Level of Consciousness: awake  Airway and Oxygen Therapy: Patient Spontanous Breathing  Post-op Pain: mild  Post-op Assessment: Post-op Vital signs reviewed  Post-op Vital Signs: stable  Complications: No apparent anesthesia complications

## 2013-07-17 NOTE — Anesthesia Preprocedure Evaluation (Addendum)
Anesthesia Evaluation  Patient identified by MRN, date of birth, ID band Patient awake    Reviewed: Allergy & Precautions, H&P , NPO status , Patient's Chart, lab work & pertinent test results, reviewed documented beta blocker date and time   History of Anesthesia Complications Negative for: history of anesthetic complications  Airway Mallampati: II TM Distance: >3 FB Neck ROM: full    Dental  (+) Edentulous Upper and Dental Advisory Given   Pulmonary shortness of breath,  breath sounds clear to auscultation        Cardiovascular hypertension, Pt. on medications and Pt. on home beta blockers + dysrhythmias Supra Ventricular Tachycardia Rhythm:Regular Rate:Normal     Neuro/Psych negative neurological ROS     GI/Hepatic (+) Hepatitis -, C  Endo/Other  Morbid obesity  Renal/GU      Musculoskeletal  (+) Arthritis -,   Abdominal   Peds  Hematology negative hematology ROS (+)   Anesthesia Other Findings   Reproductive/Obstetrics negative OB ROS                         Anesthesia Physical Anesthesia Plan  ASA: III  Anesthesia Plan: General and Regional   Post-op Pain Management: MAC Combined w/ Regional for Post-op pain   Induction: Intravenous  Airway Management Planned: Oral ETT  Additional Equipment:   Intra-op Plan:   Post-operative Plan: Extubation in OR  Informed Consent: I have reviewed the patients History and Physical, chart, labs and discussed the procedure including the risks, benefits and alternatives for the proposed anesthesia with the patient or authorized representative who has indicated his/her understanding and acceptance.     Plan Discussed with: CRNA, Anesthesiologist and Surgeon  Anesthesia Plan Comments:         Anesthesia Quick Evaluation

## 2013-07-17 NOTE — Interval H&P Note (Signed)
History and Physical Interval Note:  07/17/2013 12:33 PM  Julia Tucker  has presented today for surgery, with the diagnosis of Left Knee Osteoarthritis  The various methods of treatment have been discussed with the patient and family. After consideration of risks, benefits and other options for treatment, the patient has consented to  Procedure(s) with comments: COMPUTER ASSISTED TOTAL KNEE ARTHROPLASTY (Left) - Left Total Knee Arthroplasty, Cemented, Computer Assist as a surgical intervention .  The patient's history has been reviewed, patient examined, no change in status, stable for surgery.  I have reviewed the patient's chart and labs.  Questions were answered to the patient's satisfaction.     YATES,MARK C

## 2013-07-17 NOTE — Brief Op Note (Cosign Needed)
07/17/2013  3:34 PM  PATIENT:  Julia Tucker  67 y.o. female  PRE-OPERATIVE DIAGNOSIS:  Left Knee Osteoarthritis  POST-OPERATIVE DIAGNOSIS:  Left Knee Osteoarthritis  PROCEDURE:  Procedure(s) with comments: COMPUTER ASSISTED TOTAL KNEE ARTHROPLASTY (Left) - Left Total Knee Arthroplasty, Cemented, Computer Assist  SURGEON:  Surgeon(s) and Role:    * Eldred Manges, MD - Primary  PHYSICIAN ASSISTANT: Maud Deed [facility]  ASSISTANTS: none   ANESTHESIA:   general  EBL:  Total I/O In: 1500 [I.V.:1500] Out: 450 [Urine:200; Blood:250]  BLOOD ADMINISTERED:none  DRAINS: none   LOCAL MEDICATIONS USED:  NONE  SPECIMEN:  No Specimen  DISPOSITION OF SPECIMEN:  N/A  COUNTS:  YES  TOURNIQUET:   Total Tourniquet Time Documented: Thigh (Left) - 77 minutes Total: Thigh (Left) - 77 minutes   DICTATION: .Note written in EPIC  PLAN OF CARE: Admit to inpatient   PATIENT DISPOSITION:  PACU - hemodynamically stable.   Delay start of Pharmacological VTE agent (>24hrs) due to surgical blood loss or risk of bleeding: yes

## 2013-07-17 NOTE — Anesthesia Procedure Notes (Addendum)
Anesthesia Regional Block:  Femoral nerve block  Pre-Anesthetic Checklist: ,, timeout performed, Correct Patient, Correct Site, Correct Laterality, Correct Procedure,, site marked, risks and benefits discussed, Surgical consent,  Pre-op evaluation,  At surgeon's request and post-op pain management  Laterality: Left  Prep: chloraprep       Needles:  Injection technique: Single-shot  Needle Type: Echogenic Stimulator Needle     Needle Length: 9cm  Needle Gauge: 21 and 21 G    Additional Needles:  Procedures: nerve stimulator Femoral nerve block  Nerve Stimulator or Paresthesia:  Response: Quadriceps muscle contraction, 0.45 mA,   Additional Responses:   Narrative:  Start time: 07/17/2013 12:11 PM End time: 07/17/2013 12:19 PM Injection made incrementally with aspirations every 5 mL.  Performed by: Personally  Anesthesiologist: Dr Chaney Malling  Additional Notes: Functioning IV was confirmed and monitors were applied.  A 90mm 21ga Arrow echogenic stimulator needle was used. Sterile prep and drape,hand hygiene and sterile gloves were used.  Negative aspiration and negative test dose prior to incremental administration of local anesthetic. The patient tolerated the procedure well.    Femoral nerve block Procedure Name: Intubation Date/Time: 07/17/2013 12:50 PM Performed by: Romie Minus K Pre-anesthesia Checklist: Patient identified, Emergency Drugs available, Suction available, Patient being monitored and Timeout performed Patient Re-evaluated:Patient Re-evaluated prior to inductionOxygen Delivery Method: Circle system utilized Preoxygenation: Pre-oxygenation with 100% oxygen Intubation Type: IV induction Ventilation: Mask ventilation without difficulty Laryngoscope Size: Miller and 2 Grade View: Grade I Tube type: Oral Tube size: 7.5 mm Number of attempts: 1 Airway Equipment and Method: Stylet Placement Confirmation: ETT inserted through vocal cords under direct vision,   positive ETCO2,  CO2 detector and breath sounds checked- equal and bilateral Secured at: 21 cm Tube secured with: Tape Dental Injury: Teeth and Oropharynx as per pre-operative assessment  Comments: Smooth IV induction. EZ mask, DL x 1 MAC 4. Estimated Grade 3 view. Esophageal intubation by Aldean Jewett Paramedic student. Recognized immediately. DL x 1 by [employer]. Grade 1 view. AOI. BBS=. +ETCO2.

## 2013-07-17 NOTE — Transfer of Care (Signed)
Immediate Anesthesia Transfer of Care Note  Patient: Julia Tucker  Procedure(s) Performed: Procedure(s) with comments: COMPUTER ASSISTED TOTAL KNEE ARTHROPLASTY (Left) - Left Total Knee Arthroplasty, Cemented, Computer Assist  Patient Location: PACU  Anesthesia Type:General  Level of Consciousness: awake, alert  and oriented  Airway & Oxygen Therapy: Patient Spontanous Breathing and Patient connected to nasal cannula oxygen  Post-op Assessment: Report given to PACU RN and Post -op Vital signs reviewed and stable  Post vital signs: Reviewed and stable  Complications: No apparent anesthesia complications

## 2013-07-17 NOTE — H&P (Signed)
TOTAL KNEE ADMISSION H&P  Patient is being admitted for left total knee arthroplasty.  Subjective:  Chief Complaint:left knee pain.  HPI: Julia Tucker, 67 y.o. female, has a history of pain and functional disability in the left knee due to arthritis and has failed non-surgical conservative treatments for greater than 12 weeks to includeNSAID's and/or analgesics, corticosteriod injections, use of assistive devices and activity modification.  Onset of symptoms was gradual, starting 2 years ago with gradually worsening course since that time. The patient noted no past surgery on the left knee(s).  Patient currently rates pain in the left knee(s) at 7 out of 10 with activity. Patient has night pain, worsening of pain with activity and weight bearing and pain that interferes with activities of daily living.  Patient has evidence of subchondral sclerosis, periarticular osteophytes and joint space narrowing by imaging studies. This patient has had successful right total knee replacement.. There is no active infection.  There are no active problems to display for this patient.  Past Medical History  Diagnosis Date  . Hypertension   . Shortness of breath     Hx: of with exertion  . Tachycardia     Hx: of PSVT - Dr. Cathlean Cower and Dr. Clydie [facility])  . Pneumonia   . Kidney stones     Hx: of  . Arthritis   . Hepatitis     Hx: of Hep C    Past Surgical History  Procedure Laterality Date  . Joint replacement    . Abdominal hysterectomy    . Tonsillectomy    . Appendectomy    . Colonoscopy w/ biopsies and polypectomy      Hx; of  . Breast surgery    . Fracture surgery      plate and 12 screws in Right ankle  . Cyst excision      Hx: of on chin    No prescriptions prior to admission   No Known Allergies  History  Substance Use Topics  . Smoking status: Former Games developer  . Smokeless tobacco: Never Used     Comment: quit 18-20 years ago  . Alcohol Use: No     Family History  Problem Relation Age of Onset  . Hypertension Mother   . Cancer - Lung Father   . Cancer - Other Sister      Review of Systems  Musculoskeletal: Positive for joint pain.       Left knee pain.  Right TKR doing well  All other systems reviewed and are negative.    Objective:  Physical Exam  Constitutional: She is oriented to person, place, and time. She appears well-developed and well-nourished.  HENT:  Head: Normocephalic and atraumatic.  Eyes: EOM are normal. Pupils are equal, round, and reactive to light.  Neck: Normal range of motion.  Cardiovascular: Normal rate and regular rhythm.   Respiratory: Effort normal and breath sounds normal.  GI: Soft.  Musculoskeletal:   the left knee shows +2 effusion today.  She has range of motion lacking 10 degrees of full extension and flexes to 110 degrees.  No ligamentous instability with valgus or varus stress.  She has distal pitting edema 1+ bilateral lower extremities.  Dorsalis pedis pulses +2 bilaterally.    Neurological: She is alert and oriented to person, place, and time.  Skin: Skin is warm and dry.  Psychiatric: She has a normal mood and affect.    Vital signs in last 24 hours:    Labs:  Estimated body mass index is 44.41 kg/(m^2) as calculated from the following:   Height as of 08/12/12: 5\' 6"  (1.676 m).   Weight as of 08/12/12: 124.739 kg (275 lb).   Imaging Review Plain radiographs demonstrate severe degenerative joint disease of the left knee(s). The overall alignment isneutral. The bone quality appears to be adequate for age and reported activity level.  Assessment/Plan:  End stage arthritis, left knee   The patient history, physical examination, clinical judgment of the provider and imaging studies are consistent with end stage degenerative joint disease of the left knee(s) and total knee arthroplasty is deemed medically necessary. The treatment options including medical management, injection  therapy arthroscopy and arthroplasty were discussed at length. The risks and benefits of total knee arthroplasty were presented and reviewed. The risks due to aseptic loosening, infection, stiffness, patella tracking problems, thromboembolic complications and other imponderables were discussed. The patient acknowledged the explanation, agreed to proceed with the plan and consent was signed. Patient is being admitted for inpatient treatment for surgery, pain control, PT, OT, prophylactic antibiotics, VTE prophylaxis, progressive ambulation and ADL's and discharge planning. The patient is planning to be discharged home with home health services

## 2013-07-17 NOTE — Progress Notes (Signed)
Orthopedic Tech Progress Note Patient Details:  Julia Tucker [birth year] [id number]  CPM Left Knee CPM Left Knee: On Left Knee Flexion (Degrees): 60 Left Knee Extension (Degrees): 0 Additional Comments: foot roll   Shawnie Pons 07/17/2013, 4:23 PM

## 2013-07-18 LAB — CBC
MCV: 81.2 fL (ref 78.0–100.0)
Platelets: 219 10*3/uL (ref 150–400)
RBC: 3.83 MIL/uL — ABNORMAL LOW (ref 3.87–5.11)
WBC: 6.5 10*3/uL (ref 4.0–10.5)

## 2013-07-18 LAB — BASIC METABOLIC PANEL
CO2: 26 mEq/L (ref 19–32)
Chloride: 103 mEq/L (ref 96–112)
Creatinine, Ser: 1.58 mg/dL — ABNORMAL HIGH (ref 0.50–1.10)
Potassium: 4.4 mEq/L (ref 3.5–5.1)
Sodium: 137 mEq/L (ref 135–145)

## 2013-07-18 MED ORDER — DEXTROSE-NACL 5-0.45 % IV SOLN
INTRAVENOUS | Status: DC
  Administered 2013-07-18 – 2013-07-19 (×3): via INTRAVENOUS

## 2013-07-18 NOTE — Progress Notes (Signed)
UR COMPLETED  

## 2013-07-18 NOTE — Progress Notes (Signed)
Bladder scan performed on patient. Amount showed only 75cc. Will continue to monitor for two more hours for spontaneous void.

## 2013-07-18 NOTE — Progress Notes (Signed)
Patient unable to void spontaneously after foley removal. She complains of no discomfort and does not have any fullness or the urge the void. Will bladder scan and if >435ml will perform in and out cath.

## 2013-07-18 NOTE — Evaluation (Signed)
Occupational Therapy Evaluation Patient Details Name: Julia Tucker MRN: [id number] DOB: 1946-11-04 Today's Date: 07/18/2013 Time: 4098-1191 OT Time Calculation (min): 29 min  OT Assessment / Plan / Recommendation History of present illness s/p elective Lt TKA   Clinical Impression   Pt demos decline in function with ADLs and ADL mobility safety and would benefit from acute OT services to address impairments to help restore PLOF to return home safely    OT Assessment  Patient needs continued OT Services    Follow Up Recommendations  Home health OT;Supervision/Assistance - 24 hour    Barriers to Discharge   Noe  Equipment Recommendations  Tub/shower bench;3 in 1 bedside comode    Recommendations for Other Services    Frequency  Min 2X/week    Precautions / Restrictions Precautions Precautions: Knee;Fall Required Braces or Orthoses: Knee Immobilizer - Left Knee Immobilizer - Left: Discontinue once straight leg raise with < 10 degree lag Restrictions LLE Weight Bearing: Weight bearing as tolerated   Pertinent Vitals/Pain 4/10    ADL  Grooming: Performed;Wash/dry hands;Wash/dry face;Min guard Where Assessed - Grooming: Supported standing Upper Body Bathing: Simulated;Set up;Supervision/safety Lower Body Bathing: Simulated;Moderate assistance Upper Body Dressing: Performed;Supervision/safety;Set up Lower Body Dressing: Performed;Moderate assistance;Maximal assistance Toilet Transfer: Performed;Min guard;Minimal assistance Toilet Transfer Method: Sit to stand Toilet Transfer Equipment: Raised toilet seat with arms (or 3-in-1 over toilet);Grab bars Toileting - Clothing Manipulation and Hygiene: Performed;Moderate assistance Where Assessed - Toileting Clothing Manipulation and Hygiene: Standing Tub/Shower Transfer Method: Not assessed Equipment Used: Rolling walker;Knee Immobilizer;Gait belt;Other (comment);Sock aid;Reacher;Long-handled sponge;Long-handled shoe horn (3 in  1) Transfers/Ambulation Related to ADLs: cues for sequencing and correct hand placement ADL Comments: pt provided with education and demo of ADL A/E    OT Diagnosis: Generalized weakness;Acute pain  OT Problem List: Pain;Decreased activity tolerance;Decreased knowledge of use of DME or AE;Impaired balance (sitting and/or standing) OT Treatment Interventions:     OT Goals(Current goals can be found in the care plan section) Acute Rehab OT Goals Patient Stated Goal: to go home  OT Goal Formulation: With patient Time For Goal Achievement: 07/25/13 Potential to Achieve Goals: Good ADL Goals Pt Will Perform Grooming: with supervision;with set-up;standing Pt Will Perform Lower Body Bathing: with mod assist;with min assist;with adaptive equipment Pt Will Perform Lower Body Dressing: with mod assist;with adaptive equipment Pt Will Transfer to Toilet: with min guard assist;with supervision;grab bars;ambulating (with 3 in 1 over toilet) Pt Will Perform Toileting - Clothing Manipulation and hygiene: with min guard assist;sitting/lateral leans;sit to/from stand Pt Will Perform Tub/Shower Transfer: with min assist;with min guard assist;tub bench  Visit Information  Last OT Received On: 07/18/13 Assistance Needed: +1 History of Present Illness: s/p elective Lt TKA       Prior Functioning     Home Living Family/patient expects to be discharged to:: Private residence Living Arrangements: Children Available Help at Discharge: Available PRN/intermittently;Family Type of Home: House Home Access: Stairs to enter Secretary/administrator of Steps: 3 Entrance Stairs-Rails: Can reach both Home Layout: One level Prior Function Level of Independence: Independent Comments: was amb with SPC "at end" when pain was increased Communication Communication: No difficulties Dominant Hand: Right         Vision/Perception Vision - History Baseline Vision: Wears glasses all the time Patient Visual  Report: No change from baseline   Cognition  Cognition Arousal/Alertness: Awake/alert Behavior During Therapy: WFL for tasks assessed/performed Overall Cognitive Status: Within Functional Limits for tasks assessed    Extremity/Trunk Assessment Upper Extremity Assessment Upper  Extremity Assessment: Overall WFL for tasks assessed     Mobility Bed Mobility Bed Mobility: Sit to Supine;Scooting to HOB Sit to Supine: 4: Min assist Scooting to [facility]: 5: Supervision;With rail Details for Bed Mobility Assistance: min A with L LE onto bed Transfers Sit to Stand: 4: Min guard;From chair/3-in-1;With armrests Stand to Sit: 4: Min guard;To chair/3-in-1;With armrests;To bed Details for Transfer Assistance: cues for hand placement and sequencing; pt requries incr time due to nausea; relies heavily on armrests for sit to stand      Exercise     Balance Balance Balance Assessed: Yes Dynamic Standing Balance Dynamic Standing - Balance Support: Left upper extremity supported;No upper extremity supported;During functional activity Dynamic Standing - Level of Assistance: 4: Min assist;5: Stand by assistance   End of Session OT - End of Session Equipment Utilized During Treatment: Gait belt;Rolling walker (3 in 1, ADL A/E) Activity Tolerance: Patient tolerated treatment well Patient left: in bed;with call bell/phone within reach CPM Left Knee CPM Left Knee: Off  GO     Margaretmary Eddy [facility] 07/18/2013, 1:29 PM

## 2013-07-18 NOTE — Progress Notes (Signed)
07/18/13 Spoke with patient about HHC, she chose [facility] from [facility] agencies list. Dava Najjar with [facility] and set up HHPT and HHOT. [employer] will provide 3N1 prior to d/c. Jacquelynn Cree RN, BSN, CCM

## 2013-07-18 NOTE — Progress Notes (Signed)
Physical Therapy Treatment Patient Details Name: Julia Tucker MRN: [id number] DOB: [birth year] Today's Date: 07/18/2013 Time: 4696-2952 PT Time Calculation (min): 25 min  PT Assessment / Plan / Recommendation  History of Present Illness s/p elective Lt TKA   PT Comments   Pt slowly progressing with therapy. Was able to incr amb distance and theraex this session. Continues to c/o nausea with activity. Cont per POC.  Follow Up Recommendations  Home health PT;Supervision/Assistance - 24 hour     Does the patient have the potential to tolerate intense rehabilitation     Barriers to Discharge        Equipment Recommendations  3in1 (PT)    Recommendations for Other Services    Frequency 7X/week   Progress towards PT Goals Progress towards PT goals: Progressing toward goals  Plan Current plan remains appropriate    Precautions / Restrictions Precautions Precautions: Knee;Fall Required Braces or Orthoses: Knee Immobilizer - Left Knee Immobilizer - Left: Discontinue once straight leg raise with < 10 degree lag Restrictions Weight Bearing Restrictions: Yes LLE Weight Bearing: Weight bearing as tolerated   Pertinent Vitals/Pain 2/10; pt premedicated     Mobility  Bed Mobility Bed Mobility: Supine to Sit;Sitting - Scoot to Edge of Bed;Sit to Supine Supine to Sit: 5: Supervision;HOB flat Sitting - Scoot to Edge of Bed: 6: Modified independent (Device/Increase time) Sit to Supine: 4: Min assist;HOB flat Scooting to [facility]: 5: Supervision;With rail Details for Bed Mobility Assistance: HOB flattened to simulate home enviroment and handrails removed; pt able to transfer supine to sit with supervision but requires min (A) to advance Lt LE onto bed with cues; requires incr time to complete tasks   Transfers Transfers: Sit to Stand;Stand to Sit Sit to Stand: 5: Supervision;From bed;From chair/3-in-1;With armrests Stand to Sit: To chair/3-in-1;With armrests;To bed;5: Supervision Details  for Transfer Assistance: cues for hand placement and safety; requires increased time; no physical (A) required  Ambulation/Gait Ambulation/Gait Assistance: 4: Min guard Ambulation Distance (Feet): 50 Feet Assistive device: Rolling walker Ambulation/Gait Assistance Details: cues for gt sequencing; encouraged to equalize step lengths; pt slowly progressing to step through gt; has difficulty incr step length Gait Pattern: Decreased stance time - left;Decreased step length - right;Antalgic;Trunk flexed Gait velocity: decr  General Gait Details: pt with continued fwd flex posture  Stairs: No Wheelchair Mobility Wheelchair Mobility: No    Exercises Total Joint Exercises Ankle Circles/Pumps: AROM;10 reps;Both;Supine Heel Slides: AAROM;Left;10 reps;Supine Hip ABduction/ADduction: AAROM;Left;10 reps;Supine Goniometric ROM: AROM Lt knee flex to 60 degrees    PT Diagnosis:    PT Problem List:   PT Treatment Interventions:     PT Goals (current goals can now be found in the care plan section) Acute Rehab PT Goals Patient Stated Goal: to go home  PT Goal Formulation: With patient Time For Goal Achievement: 07/25/13 Potential to Achieve Goals: Good  Visit Information  Last PT Received On: 07/18/13 Assistance Needed: +1 History of Present Illness: s/p elective Lt TKA    Subjective Data  Subjective: Pt lying supine; stated "i just got in bed but we can go. Im just hoping i can pee"  Patient Stated Goal: to go home    Cognition  Cognition Arousal/Alertness: Awake/alert Behavior During Therapy: WFL for tasks assessed/performed Overall Cognitive Status: Within Functional Limits for tasks assessed    Balance  Balance Balance Assessed: No Dynamic Standing Balance Dynamic Standing - Balance Support: Left upper extremity supported;No upper extremity supported;During functional activity Dynamic Standing - Level of Assistance:  4: Min assist;5: Stand by assistance  End of Session PT - End of  Session Equipment Utilized During Treatment: Gait belt;Left knee immobilizer Activity Tolerance: Patient tolerated treatment well Patient left: in bed;with call bell/phone within reach Nurse Communication: Mobility status CPM Left Knee CPM Left Knee: [phone]   GP     Donnamarie Poag [place], [place] [phone] 07/18/2013, 2:51 PM

## 2013-07-18 NOTE — Progress Notes (Signed)
Orthopedic Tech Progress Note Patient Details:  Julia Tucker 17-Jul-1946 [id number] On cpm at 8:00 pm LLE 0-65 Patient ID: Julia Tucker, female   DOB: [birth year], 67 y.o.   MRN: [id number]   Julia Tucker 07/18/2013, 7:57 PM

## 2013-07-18 NOTE — Progress Notes (Signed)
Patient still unable to void spontaneously and complaining of some discomfort and the urge to void. Bladder scan performed and found over in the bladder. Will in and out cath as she has not voided in 8 hours and is complaining of discomfort per protocol.

## 2013-07-18 NOTE — Progress Notes (Signed)
Subjective: 1 Day Post-Op Procedure(s) (LRB): COMPUTER ASSISTED TOTAL KNEE ARTHROPLASTY (Left) Patient reports pain as 1 on 0-10 scale.    Objective: Vital signs in last 24 hours: Temp:  [97.1 F (36.2 C)-98.2 F (36.8 C)] 98.2 F (36.8 C) (09/11 0547) Pulse Rate:  [47-60] 58 (09/11 0547) Resp:  [9-20] 16 (09/11 0547) BP: (111-177)/(58-87) 111/58 mmHg (09/11 0547) SpO2:  [95 %-100 %] 98 % (09/11 0547)  Intake/Output from previous day: 09/10 0701 - 09/11 0700 In: 2260 [P.O.:360; I.V.:1900] Out: 1350 [Urine:1100; Blood:250] Intake/Output this shift:     Recent Labs  07/18/13 0538  HGB 10.8*    Recent Labs  07/18/13 0538  WBC 6.5  RBC 3.83*  HCT 31.1*  PLT 219    Recent Labs  07/18/13 0538  NA 137  K 4.4  CL 103  CO2 26  BUN 17  CREATININE 1.58*  GLUCOSE 138*  CALCIUM 8.2*   No results found for this basename: LABPT, INR,  in the last 72 hours  Neurologically intact  Assessment/Plan: 1 Day Post-Op Procedure(s) (LRB): COMPUTER ASSISTED TOTAL KNEE ARTHROPLASTY (Left) Up with therapy, creatinine 1.58 has increased , will leave IV , , remove KCl   Recheck in AM  Ardith Test C 07/18/2013, 8:05 AM

## 2013-07-18 NOTE — Evaluation (Signed)
Physical Therapy Evaluation Patient Details Name: Julia Tucker MRN: [id number] DOB: 07-14-1946 Today's Date: 07/18/2013 Time: 1478-2956 PT Time Calculation (min): 31 min  PT Assessment / Plan / Recommendation History of Present Illness  s/p elective Lt TKA  Clinical Impression  Pt is s/p Lt TKA POD#1 resulting in the deficits listed below (see PT Problem List).  Pt greatly limited in therapy this morning due to incr nausea. Pt required increased time for all transfers and mobility due to nausea. RN notified and medicated pt. Pt highly motivated to D/C home and refusing rehab at this time.Pt will benefit from skilled PT to increase their independence and safety with mobility to allow discharge to the venue listed below.     PT Assessment  Patient needs continued PT services    Follow Up Recommendations  Home health PT;Supervision/Assistance - 24 hour    Does the patient have the potential to tolerate intense rehabilitation      Barriers to Discharge   pt reports she can have friends/family check in on her while son is out     Equipment Recommendations  3in1 (PT)    Recommendations for Other Services     Frequency 7X/week    Precautions / Restrictions Precautions Precautions: Knee;Fall Precaution Comments: given  TKA HEP handout' Required Braces or Orthoses: Knee Immobilizer - Left Knee Immobilizer - Left: Discontinue once straight leg raise with < 10 degree lag Restrictions Weight Bearing Restrictions: Yes LLE Weight Bearing: Weight bearing as tolerated   Pertinent Vitals/Pain "no pain" pt premedicated       Mobility  Bed Mobility Bed Mobility: Supine to Sit;Sitting - Scoot to Edge of Bed Supine to Sit: 5: Supervision;HOB elevated;With rails Sitting - Scoot to Edge of Bed: 5: Supervision Details for Bed Mobility Assistance: min cues for sequencing; no physical (A) required  Transfers Transfers: Sit to Stand;Stand to Sit Sit to Stand: 4: Min guard;From bed;From  chair/3-in-1;With armrests Stand to Sit: 4: Min guard;To chair/3-in-1;With armrests Details for Transfer Assistance: cues for hand placement and sequencing; pt requries incr time due to nausea; relies heavily on armrests for sit to stand  (min guard for safety ) Ambulation/Gait Ambulation/Gait Assistance: 4: Min guard;4: Min assist Ambulation Distance (Feet): 10 Feet (5, rest, 5 feet ) Assistive device: Rolling walker Ambulation/Gait Assistance Details: cues for gt sequencing and RW management; Lt LE was buckling; pt requries (A) to maintain balance and for safety; limited by nausea; requires 1 sitting rest break to reach bathroom  Gait Pattern: Decreased stance time - left;Decreased step length - right;Antalgic;Trunk flexed Gait velocity: decr due to nausea  General Gait Details: pt with fwd flex posture and anterior lean on RW: mod cues to correct for safety  (pt req increased time for gt and transfers due to nausea ) Stairs: No Wheelchair Mobility Wheelchair Mobility: No    Exercises Total Joint Exercises Ankle Circles/Pumps: Both;10 reps;AROM;Seated   PT Diagnosis: Difficulty walking;Acute pain  PT Problem List: Decreased strength;Decreased range of motion;Decreased activity tolerance;Decreased mobility;Decreased balance;Decreased knowledge of use of DME;Decreased safety awareness;Pain PT Treatment Interventions: DME instruction;Gait training;Stair training;Functional mobility training;Therapeutic activities;Therapeutic exercise;Balance training;Neuromuscular re-education;Patient/family education     PT Goals(Current goals can be found in the care plan section) Acute Rehab PT Goals Patient Stated Goal: to go home  PT Goal Formulation: With patient Time For Goal Achievement: 07/25/13 Potential to Achieve Goals: Good  Visit Information  Last PT Received On: 07/18/13 Assistance Needed: +1 History of Present Illness: s/p elective Lt TKA  Prior Functioning  Home  Living Family/patient expects to be discharged to:: Private residence Living Arrangements: Children Available Help at Discharge: Available PRN/intermittently;Family Type of Home: House Home Access: Stairs to enter [employer] of Steps: 3 Entrance Stairs-Rails: Can reach both Home Layout: One level Home Equipment: Cane - single point;Walker - 2 wheels Additional Comments: pt has tub shower; standard height toilet seat Prior Function Level of Independence: Independent Comments: was amb with SPC "at end" when pain was increased Communication Communication: No difficulties Dominant Hand: Right    Cognition  Cognition Arousal/Alertness: Awake/alert Behavior During Therapy: WFL for tasks assessed/performed Overall Cognitive Status: Within Functional Limits for tasks assessed    Extremity/Trunk Assessment Upper Extremity Assessment Upper Extremity Assessment: Defer to OT evaluation Lower Extremity Assessment Lower Extremity Assessment: LLE deficits/detail LLE: Unable to fully assess due to pain Cervical / Trunk Assessment Cervical / Trunk Assessment: Normal   Balance Balance Balance Assessed: Yes Static Sitting Balance Static Sitting - Balance Support: Bilateral upper extremity supported;Feet unsupported Static Sitting - Level of Assistance: 5: Stand by assistance Static Sitting - Comment/# of Minutes: pt tolerated sitting ~5 min to take deep breaths and control nausea   End of Session PT - End of Session Equipment Utilized During Treatment: Gait belt;Left knee immobilizer Activity Tolerance: Other (comment) (nausea ) Patient left: in chair;with call bell/phone within reach Nurse Communication: Mobility status;Other (comment) (pt c/o nausea ) CPM Left Knee CPM Left Knee: Off Additional Comments: foot roll (refused foot roll)  GP     Donell Sievert, [place] [phone] 07/18/2013, 9:17 AM

## 2013-07-19 ENCOUNTER — Encounter (HOSPITAL_COMMUNITY): Payer: Self-pay | Admitting: Orthopaedic Surgery

## 2013-07-19 DIAGNOSIS — N289 Disorder of kidney and ureter, unspecified: Secondary | ICD-10-CM

## 2013-07-19 LAB — CBC
HCT: 25.2 % — ABNORMAL LOW (ref 36.0–46.0)
Hemoglobin: 8.9 g/dL — ABNORMAL LOW (ref 12.0–15.0)
MCV: 79.7 fL (ref 78.0–100.0)
RBC: 3.16 MIL/uL — ABNORMAL LOW (ref 3.87–5.11)
WBC: 6.9 10*3/uL (ref 4.0–10.5)

## 2013-07-19 LAB — COMPREHENSIVE METABOLIC PANEL
AST: 40 U/L — ABNORMAL HIGH (ref 0–37)
CO2: 24 mEq/L (ref 19–32)
Calcium: 8.3 mg/dL — ABNORMAL LOW (ref 8.4–10.5)
Chloride: 99 mEq/L (ref 96–112)
Creatinine, Ser: 1.53 mg/dL — ABNORMAL HIGH (ref 0.50–1.10)
GFR calc Af Amer: 40 mL/min — ABNORMAL LOW (ref >90)
GFR calc non Af Amer: 34 mL/min — ABNORMAL LOW (ref >90)
Glucose, Bld: 110 mg/dL — ABNORMAL HIGH (ref 70–99)
Total Bilirubin: 0.8 mg/dL (ref 0.3–1.2)

## 2013-07-19 LAB — BASIC METABOLIC PANEL
BUN: 21 mg/dL (ref 6–23)
CO2: 25 mEq/L (ref 19–32)
Chloride: 100 mEq/L (ref 96–112)
Creatinine, Ser: 1.64 mg/dL — ABNORMAL HIGH (ref 0.50–1.10)
GFR calc Af Amer: 37 mL/min — ABNORMAL LOW (ref >90)
Glucose, Bld: 104 mg/dL — ABNORMAL HIGH (ref 70–99)
Potassium: 3.6 mEq/L (ref 3.5–5.1)

## 2013-07-19 NOTE — Progress Notes (Signed)
Physical Therapy Treatment Patient Details Name: Julia Tucker MRN: [id number] DOB: [birth year] Today's Date: 07/19/2013 Time: 8657-8469 PT Time Calculation (min): 24 min  PT Assessment / Plan / Recommendation  History of Present Illness s/p elective Lt TKA   PT Comments   Pt progressing well with mobility today. Able to incr theraex and amb distance with supervision (A). Pt reports her daughter will be here later today, will plan on addressing stair amb goal with family present. Possible D/C from mobility standpoint today if pt is safe to amb steps.   Follow Up Recommendations  Home health PT;Supervision/Assistance - 24 hour     Does the patient have the potential to tolerate intense rehabilitation     Barriers to Discharge        Equipment Recommendations  3in1 (PT)    Recommendations for Other Services    Frequency 7X/week   Progress towards PT Goals Progress towards PT goals: Progressing toward goals  Plan Current plan remains appropriate    Precautions / Restrictions Precautions Precautions: Knee;Fall Required Braces or Orthoses: Knee Immobilizer - Left Restrictions Weight Bearing Restrictions: Yes LLE Weight Bearing: Weight bearing as tolerated   Pertinent Vitals/Pain 7/10; pt premedicated     Mobility  Bed Mobility Bed Mobility: Supine to Sit;Sitting - Scoot to Edge of Bed Supine to Sit: 5: Supervision;HOB flat Sitting - Scoot to Edge of Bed: 6: Modified independent (Device/Increase time) Details for Bed Mobility Assistance: pt demo good technique to come to long sit then swing Lt LE off EOB; min cues for sequencing; no physical (A) needed Transfers Transfers: Sit to Stand;Stand to Sit Sit to Stand: From bed;5: Supervision Stand to Sit: 5: Supervision;To chair/3-in-1;With armrests Details for Transfer Assistance: pt demo good technique; supervision for min cues for safety; no physical (A) needed  Ambulation/Gait Ambulation/Gait Assistance: 5:  Supervision Ambulation Distance (Feet): 200 Feet Assistive device: Rolling walker Ambulation/Gait Assistance Details: pt able to progress to step through gt with cues and demo; pt demo SOB at end of amb but was conversing; no buckling of Lt LE this session  Gait Pattern: Step-through pattern;Trunk flexed Gait velocity: decr  General Gait Details: min cues for upright posture  Stairs: No Wheelchair Mobility Wheelchair Mobility: No    Exercises Total Joint Exercises Ankle Circles/Pumps: AROM;10 reps;Both;Supine Quad Sets: AROM;Left;10 reps;Seated Heel Slides: AROM;Left;10 reps;Seated Long Arc Quad: AROM;Left;10 reps;Seated Knee Flexion: AAROM;Left;10 reps;Seated Goniometric ROM: AROM Lt knee flex 70 degrees in sitting    PT Diagnosis:    PT Problem List:   PT Treatment Interventions:     PT Goals (current goals can now be found in the care plan section) Acute Rehab PT Goals Patient Stated Goal: to go home  PT Goal Formulation: With patient Time For Goal Achievement: 07/25/13 Potential to Achieve Goals: Good  Visit Information  Last PT Received On: 07/19/13 Assistance Needed: +1 History of Present Illness: s/p elective Lt TKA    Subjective Data  Subjective: Pt lying supine; agreeable to therapy. "i think i laid around too much yesterday, im more sore today"  Patient Stated Goal: to go home    Cognition  Cognition Arousal/Alertness: Awake/alert Behavior During Therapy: WFL for tasks assessed/performed Overall Cognitive Status: Within Functional Limits for tasks assessed    Balance  Balance Balance Assessed: No  End of Session PT - End of Session Equipment Utilized During Treatment: Gait belt;Left knee immobilizer Activity Tolerance: Patient tolerated treatment well   GP     Shelva Majestic [place], [place] [phone]  07/19/2013, 9:09 AM

## 2013-07-19 NOTE — Progress Notes (Signed)
Physical Therapy Treatment Patient Details Name: Julia Tucker MRN: [id number] DOB: 07-15-1946 Today's Date: 07/19/2013 Time: 7829-5621 PT Time Calculation (min): 24 min  PT Assessment / Plan / Recommendation  History of Present Illness s/p elective Lt TKA   PT Comments   Pt progressing very well with therapy. Was able to amb steps with daughter present for family education. Pt would benefit from practicing steps in next session prior to D/C home. Pt amb at supervision level. Cont per POC till pt is medically stable for D/C.   Follow Up Recommendations  Home health PT;Supervision/Assistance - 24 hour     Does the patient have the potential to tolerate intense rehabilitation     Barriers to Discharge        Equipment Recommendations  3in1 (PT)    Recommendations for Other Services    Frequency 7X/week   Progress towards PT Goals Progress towards PT goals: Progressing toward goals  Plan Current plan remains appropriate    Precautions / Restrictions Precautions Precautions: Knee Required Braces or Orthoses: Knee Immobilizer - Left Knee Immobilizer - Left:  (pt demo good stability without KI) Restrictions Weight Bearing Restrictions: Yes LLE Weight Bearing: Weight bearing as tolerated   Pertinent Vitals/Pain 7/10; pt premedicated     Mobility  Bed Mobility Bed Mobility: Supine to Sit;Sitting - Scoot to Edge of Bed;Sit to Supine Supine to Sit: 6: Modified independent (Device/Increase time);HOB flat Sitting - Scoot to Edge of Bed: 6: Modified independent (Device/Increase time) Sit to Supine: 5: Supervision;HOB flat Details for Bed Mobility Assistance: supervision for sit to supine only for cues for technique; pt demo good technqiue for supine to sit  Transfers Transfers: Sit to Stand;Stand to Sit Sit to Stand: 5: Supervision;From bed Stand to Sit: 5: Supervision;To chair/3-in-1;With armrests Details for Transfer Assistance: min cues for hand placement and  technique Ambulation/Gait Ambulation/Gait Assistance: 5: Supervision Ambulation Distance (Feet): 500 Feet Assistive device: Rolling walker Ambulation/Gait Assistance Details: min cues for gt sequencing; with cues pt was able to progress and continuously amb with step through gt; cues for upright posture and RW management; pt did not demo any buckling of Lt LE and was able to amb without KI Gait Pattern: Step-through pattern;Trunk flexed Gait velocity: decr  General Gait Details: min cues for upright posture  Stairs: Yes Stairs Assistance: 5: Supervision;4: Min guard Stairs Assistance Details (indicate cue type and reason): initially pt was min guard; progressed to supervision (A) with cues for technique and hand placement; pt prferred amb up sideways using Lt handrail going up  Stair Management Technique: One rail Left;Sideways;Step to pattern Number of Stairs: 7 Wheelchair Mobility Wheelchair Mobility: No    Exercises Total Joint Exercises Ankle Circles/Pumps: AROM;10 reps;Both;Supine Quad Sets: AROM;Left;10 reps;Supine Heel Slides: AROM;Left;10 reps;Seated Hip ABduction/ADduction: AAROM;Left;10 reps;Seated Long Arc Quad: AROM;Left;10 reps;Seated Knee Flexion: AAROM;Left;10 reps;Seated Goniometric ROM: AROM Lt knee flex 70 degrees in sitting    PT Diagnosis:    PT Problem List:   PT Treatment Interventions:     PT Goals (current goals can now be found in the care plan section) Acute Rehab PT Goals Patient Stated Goal: home tomorrow PT Goal Formulation: With patient Time For Goal Achievement: 07/25/13 Potential to Achieve Goals: Good  Visit Information  Last PT Received On: 07/19/13 Assistance Needed: +1 History of Present Illness: s/p elective Lt TKA    Subjective Data  Subjective: pt lying in bed; daughter present; "im not going home today because of my kindneys but id like to  practice steps with her here" Patient Stated Goal: home tomorrow   Cognition   Cognition Arousal/Alertness: Awake/alert Behavior During Therapy: WFL for tasks assessed/performed Overall Cognitive Status: Within Functional Limits for tasks assessed    Balance  Balance Balance Assessed: No  End of Session PT - End of Session Equipment Utilized During Treatment: Gait belt Activity Tolerance: Patient tolerated treatment well Patient left: in bed;with call bell/phone within reach;with family/visitor present Nurse Communication: Mobility status CPM Left Knee CPM Left Knee: [address]   GP     Donnamarie Poag [place], [place] [phone] 07/19/2013, 12:52 PM

## 2013-07-19 NOTE — Progress Notes (Signed)
Subjective: 2 Days Post-Op Procedure(s) (LRB): COMPUTER ASSISTED TOTAL KNEE ARTHROPLASTY (Left) Patient reports pain as mild.  Urinary retention yesterday, but began voiding last night and has been voiding today. Making more urine and drinking more today.  IV still at 75cc/hr Walking in hallway.  Objective: Vital signs in last 24 hours: Temp:  [97.9 F (36.6 C)-101.1 F (38.4 C)] 98.5 F (36.9 C) (09/12 1300) Pulse Rate:  [56-76] 65 (09/12 1300) Resp:  [16] 16 (09/12 1300) BP: (115-141)/(53-59) 118/53 mmHg (09/12 1300) SpO2:  [93 %-97 %] 93 % (09/12 1300)  Intake/Output from previous day: 09/11 0701 - 09/12 0700 In: 1211.3 [P.O.:720; I.V.:491.3] Out: 200 [Urine:200] Intake/Output this shift:     Recent Labs  07/18/13 0538 07/19/13 0755  HGB 10.8* 8.9*    Recent Labs  07/18/13 0538 07/19/13 0755  WBC 6.5 6.9  RBC 3.83* 3.16*  HCT 31.1* 25.2*  PLT 219 182    Recent Labs  07/18/13 0538 07/19/13 0755  NA 137 133*  K 4.4 3.6  CL 103 100  CO2 26 25  BUN 17 21  CREATININE 1.58* 1.64*  GLUCOSE 138* 104*  CALCIUM 8.2* 8.0*   No results found for this basename: LABPT, INR,  in the last 72 hours  Neurovascular intact Sensation intact distally Intact pulses distally Dorsiflexion/Plantar flexion intact Incision: no drainage  Assessment/Plan: 2 Days Post-Op Procedure(s) (LRB): COMPUTER ASSISTED TOTAL KNEE ARTHROPLASTY (Left) Up with therapy Cr continues to slowly elevate.  Now at 1.64.  Producing more urine now.  Will need to monitor Cr for another day and discharge to home in am if values stabilizing. Continue IVF and check labs in am rx on chart.  Sitara Cashwell M 07/19/2013, 1:33 PM

## 2013-07-19 NOTE — Progress Notes (Signed)
Occupational Therapy Discharge Patient Details Name: Julia Tucker MRN: [id number] DOB: [birth year] Today's Date: 07/19/2013 Time:  -     Patient discharged from OT services secondary to pt reports she can manage her own BADLs and does not need any further OT. Pt reports she dressed herself this morning (pt had regular clothes on)..  Please see latest therapy progress note for current level of functioning and progress toward goals.    Progress and discharge plan discussed with patient and/or caregiver: Patient/Caregiver agrees with plan       Evette Georges [phone] 07/19/2013, 3:50 PM

## 2013-07-19 NOTE — Progress Notes (Signed)
Subjective: 2 Days Post-Op Procedure(s) (LRB): COMPUTER ASSISTED TOTAL KNEE ARTHROPLASTY (Left) Patient reports pain as mild.    Objective: Vital signs in last 24 hours: Temp:  [97.9 F (36.6 C)-101.1 F (38.4 C)] 99.5 F (37.5 C) (09/12 0606) Pulse Rate:  [56-76] 74 (09/12 0457) Resp:  [16] 16 (09/12 0457) BP: (115-141)/(54-59) 134/54 mmHg (09/12 0457) SpO2:  [95 %-97 %] 95 % (09/12 0457)  Intake/Output from previous day: 09/11 0701 - 09/12 0700 In: 1211.3 [P.O.:720; I.V.:491.3] Out: 200 [Urine:200] Intake/Output this shift:     Recent Labs  07/18/13 0538 07/19/13 0755  HGB 10.8* 8.9*    Recent Labs  07/18/13 0538 07/19/13 0755  WBC 6.5 6.9  RBC 3.83* 3.16*  HCT 31.1* 25.2*  PLT 219 182    Recent Labs  07/18/13 0538 07/19/13 0755  NA 137 133*  K 4.4 3.6  CL 103 100  CO2 26 25  BUN 17 21  CREATININE 1.58* 1.64*  GLUCOSE 138* 104*  CALCIUM 8.2* 8.0*   No results found for this basename: LABPT, INR,  in the last 72 hours  Neurologically intact  Assessment/Plan: 2 Days Post-Op Procedure(s) (LRB): COMPUTER ASSISTED TOTAL KNEE ARTHROPLASTY (Left) Up with therapy  Creatinine has continued to rise. Will need to see it peak then she can go home, likely Sat.  But could be Sunday.    Dx   Post op renal insufficiency .      We will continue IV at 75cc hr  YATES,MARK C 07/19/2013, 10:44 AM

## 2013-07-20 LAB — CBC
Hemoglobin: 8.6 g/dL — ABNORMAL LOW (ref 12.0–15.0)
MCH: 27.6 pg (ref 26.0–34.0)
MCHC: 34 g/dL (ref 30.0–36.0)
Platelets: 185 10*3/uL (ref 150–400)
RBC: 3.12 MIL/uL — ABNORMAL LOW (ref 3.87–5.11)

## 2013-07-20 MED ORDER — OXYCODONE-ACETAMINOPHEN 5-325 MG PO TABS: 1.0000 | ORAL_TABLET | ORAL | Status: DC | PRN

## 2013-07-20 NOTE — Care Management Note (Signed)
    Page 1 of 1   07/20/2013     1:55:03 PM   CARE MANAGEMENT NOTE 07/20/2013  Patient:  Julia Tucker, Julia Tucker   Account Number:  [id number]  Date Initiated:  07/18/2013  Documentation initiated by:  Jacquelynn Cree  Subjective/Objective Assessment:   admitted postop left total knee arthroplasty     Action/Plan:   PT/OT evals-recommended HHPT and HHOT   Anticipated DC Date:  07/20/2013   Anticipated DC Plan:  HOME W HOME HEALTH SERVICES      DC Planning Services  CM consult      Choice offered to / List presented to:  C-1 Patient        HH arranged  HH-2 PT  HH-3 OT      [facility] agency  [facility]   Status of service:  Completed, signed off Medicare Important Message given?   (If response is "NO", the following Medicare IM given date fields will be blank) Date Medicare IM given:   Date Additional Medicare IM given:    Discharge Disposition:  HOME W HOME HEALTH SERVICES  Per UR Regulation:    If discussed at Long Length of Stay Meetings, dates discussed:    Comments:  07/20/13 13:50 CM spoke with [facility] to inform of discharge today.  No other CM needs communicated.  Freddy Jaksch, BSN, [place] [phone].   07/18/13 Spoke with patient about HHC, she chose [facility] from [facility] agencies list. Dava Najjar with [facility] and set up HHPT and HHOT. [employer] will provide 3N1 prior to d/c. Jacquelynn Cree RN, BSN, CCM

## 2013-07-20 NOTE — Progress Notes (Addendum)
Patient discharged at 1330. She received discharge instructions with verbalized understanding. Brought down to lobby via wheelchair where family is waiting in the car.

## 2013-07-20 NOTE — Discharge Summary (Signed)
  Final diagnoses osteoarthritis knee. Discharge to home in stable condition. Prescription for Percocet for pain. Home health physical therapy. Followup Dr. Ophelia Charter in 2 weeks.

## 2013-07-20 NOTE — Progress Notes (Signed)
Physical Therapy Treatment Patient Details Name: Julia Tucker MRN: [id number] DOB: [birth year] Today's Date: 07/20/2013 Time: 9147-8295 PT Time Calculation (min): 23 min  PT Assessment / Plan / Recommendation  History of Present Illness s/p elective Lt TKA   PT Comments   Patient continues to make great progress. Ready for DC today based on goals set and progression made with therapy.   Follow Up Recommendations  Home health PT;Supervision/Assistance - 24 hour     Does the patient have the potential to tolerate intense rehabilitation     Barriers to Discharge        Equipment Recommendations  3in1 (PT)    Recommendations for Other Services    Frequency 7X/week   Progress towards PT Goals Progress towards PT goals: Progressing toward goals  Plan Current plan remains appropriate    Precautions / Restrictions Precautions Precautions: Knee Restrictions Weight Bearing Restrictions: Yes LLE Weight Bearing: Weight bearing as tolerated   Pertinent Vitals/Pain no apparent distress    Mobility  Bed Mobility Bed Mobility: Not assessed Transfers Sit to Stand: 6: Modified independent (Device/Increase time) Stand to Sit: 6: Modified independent (Device/Increase time) Details for Transfer Assistance: x2 Ambulation/Gait Ambulation/Gait Assistance: 6: Modified independent (Device/Increase time) Ambulation Distance (Feet): 500 Feet Assistive device: Rolling walker Ambulation/Gait Assistance Details: Patient ambulating with step through pattern, no knee buckling evident Gait Pattern: Step-through pattern Stairs Assistance: 5: Supervision Stair Management Technique: One rail Left;Sideways;Step to pattern Number of Stairs: 5    Exercises Total Joint Exercises Quad Sets: AROM;Left;10 reps;Supine Heel Slides: AROM;Left;10 reps;Seated Hip ABduction/ADduction: AAROM;Left;10 reps;Seated Straight Leg Raises: AROM;10 reps;Left Long Arc Quad: AROM;Left;10 reps;Seated   PT Diagnosis:     PT Problem List:   PT Treatment Interventions:     PT Goals (current goals can now be found in the care plan section)    Visit Information  Last PT Received On: 07/20/13 Assistance Needed: +1 History of Present Illness: s/p elective Lt TKA    Subjective Data      Cognition  Cognition Arousal/Alertness: Awake/alert Behavior During Therapy: WFL for tasks assessed/performed Overall Cognitive Status: Within Functional Limits for tasks assessed    Balance     End of Session PT - End of Session Equipment Utilized During Treatment: Gait belt Activity Tolerance: Patient tolerated treatment well Patient left: in bed;with call bell/phone within reach;with family/visitor present Nurse Communication: Mobility status CPM Left Knee CPM Left Knee: Off   GP     Fredrich Birks 07/20/2013, 9:13 AM  07/20/2013 Fredrich Birks PTA [phone] pager [phone] office

## 2013-07-29 ENCOUNTER — Other Ambulatory Visit (HOSPITAL_COMMUNITY): Payer: Self-pay | Admitting: Orthopaedic Surgery

## 2013-07-29 ENCOUNTER — Ambulatory Visit (HOSPITAL_COMMUNITY)
Admission: RE | Admit: 2013-07-29 | Discharge: 2013-07-29 | Disposition: A | Discharge status: Home / Self Care | Payer: Medicare Other | Source: Ambulatory Visit | Attending: Orthopaedic Surgery | Admitting: Orthopaedic Surgery

## 2013-07-29 DIAGNOSIS — M25562 Pain in left knee: Secondary | ICD-10-CM

## 2013-07-29 DIAGNOSIS — Z01810 Encounter for preprocedural cardiovascular examination: Secondary | ICD-10-CM

## 2013-07-29 DIAGNOSIS — M79609 Pain in unspecified limb: Secondary | ICD-10-CM | POA: Insufficient documentation

## 2013-07-29 DIAGNOSIS — M7989 Other specified soft tissue disorders: Secondary | ICD-10-CM

## 2013-07-29 NOTE — Progress Notes (Signed)
VASCULAR LAB PRELIMINARY  PRELIMINARY  PRELIMINARY  PRELIMINARY  Left lower extremity venous duplex completed.    Preliminary report:  Left:  No evidence of DVT, superficial thrombosis, or Baker's cyst.  Jazz Rogala, RVS 07/29/2013, 2:51 PM

## 2014-05-27 IMAGING — CT CT ANGIO CHEST
2 of 7 series · 19 of 36 positions shown · IV contrast (APPLIED)
Comparison: 08/12/2012

CLINICAL DATA: Midsternal chest pain and pressure.  Shortness of
breath.  Elevated D-dimer.

CT ANGIOGRAPHY CHEST
TECHNIQUE: Multidetector CT imaging of the chest using the
standard protocol during bolus administration of intravenous
contrast. Multiplanar reconstructed images including MIPs were
obtained and reviewed to evaluate the vascular anatomy.
Contrast: 100mL OMNIPAQUE IOHEXOL 350 MG/ML SOLN

[Series 6: pe 1.0 b25f · axial · 0.61mm/px · z∈[-249,-42]mm · 18 of 231 slices shown]
[im 12/231  lung]
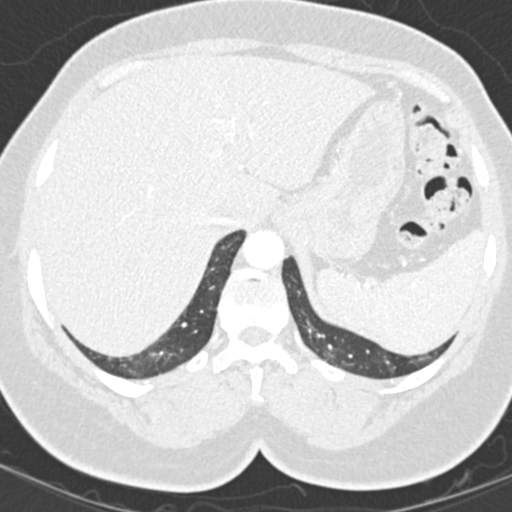
[im 24/231  mediastinal]
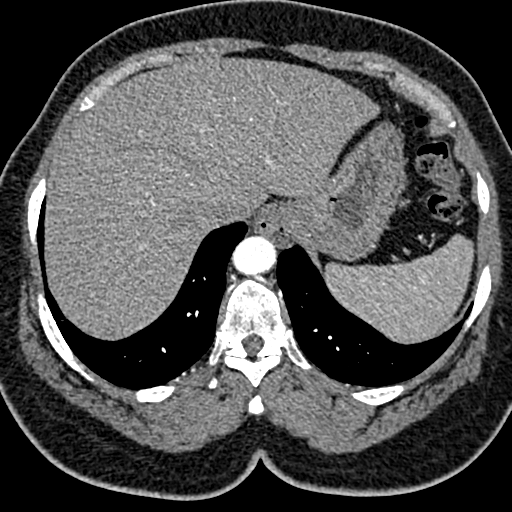
[im 35/231  lung]
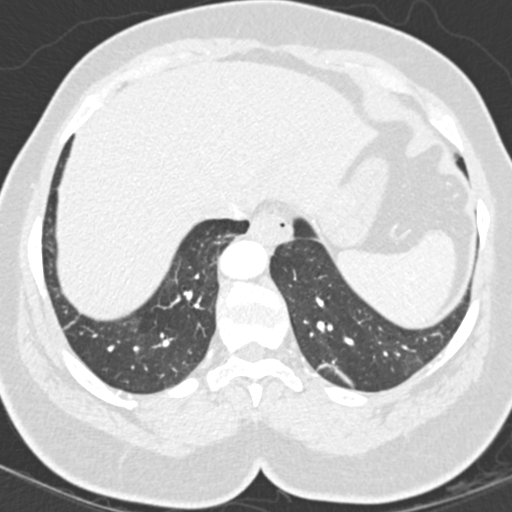
[im 47/231  mediastinal]
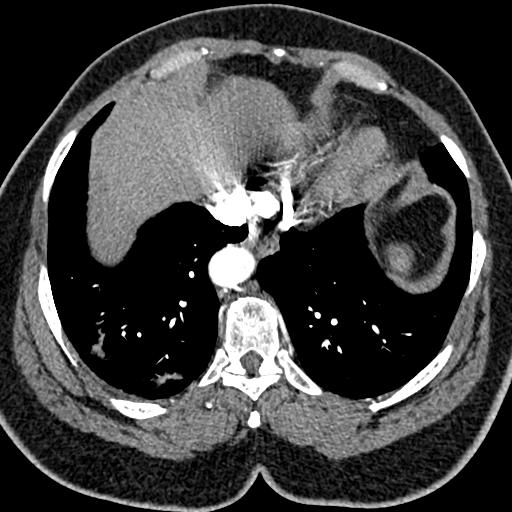
[im 58/231  lung]
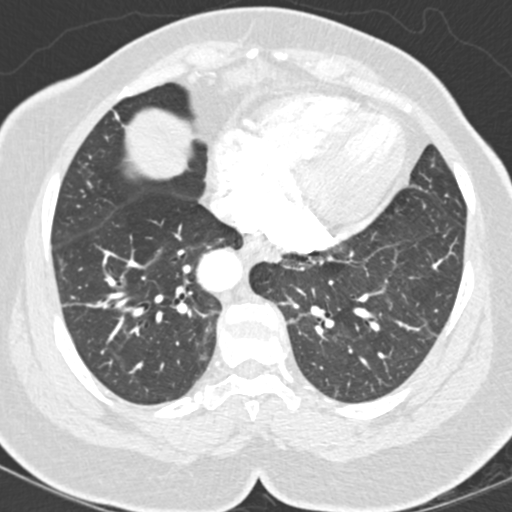
[im 70/231  mediastinal]
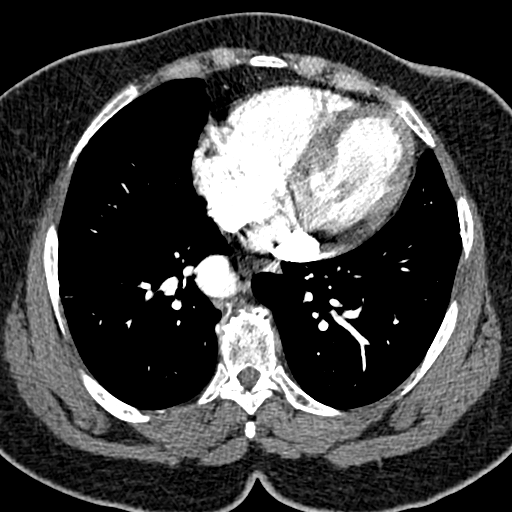
[im 81/231  lung]
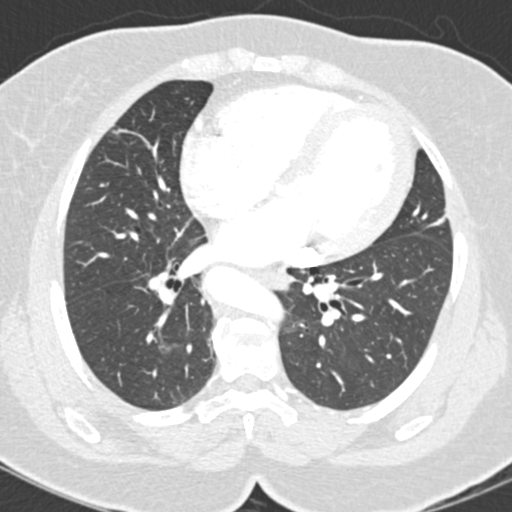
[im 93/231  mediastinal]
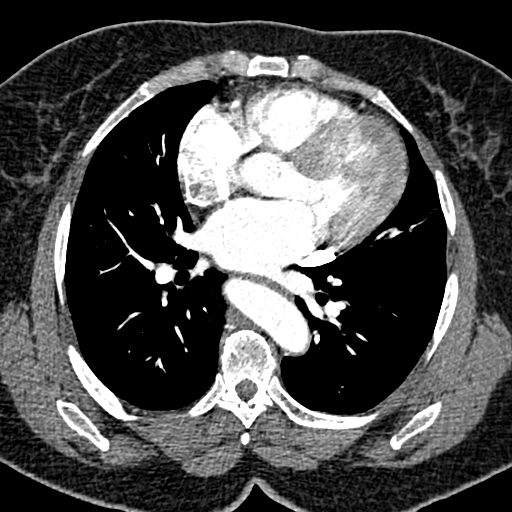
[im 104/231  lung]
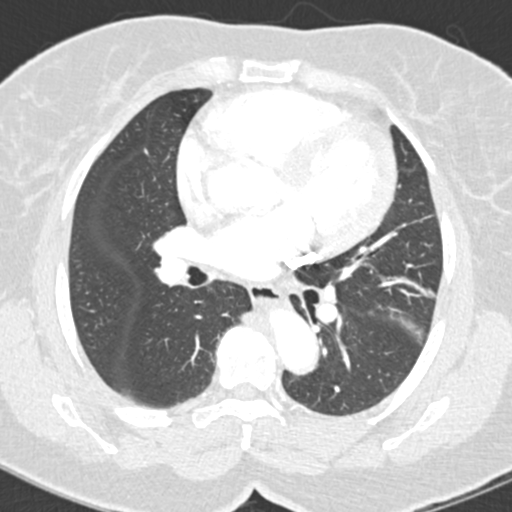
[im 127/231  mediastinal]
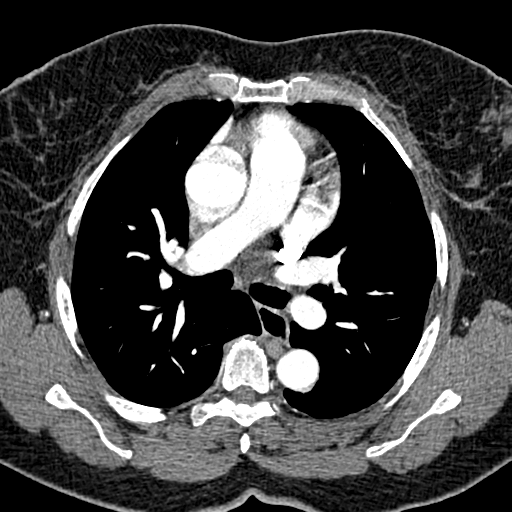
[im 139/231  lung]
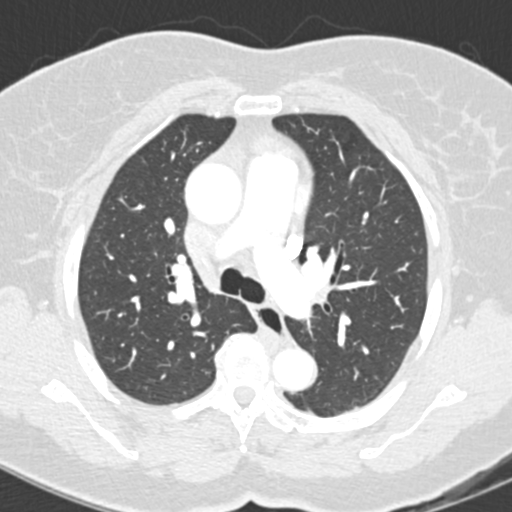
[im 150/231  mediastinal]
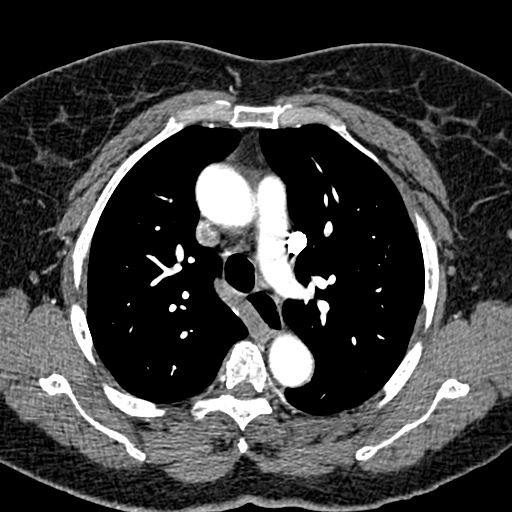
[im 162/231  lung]
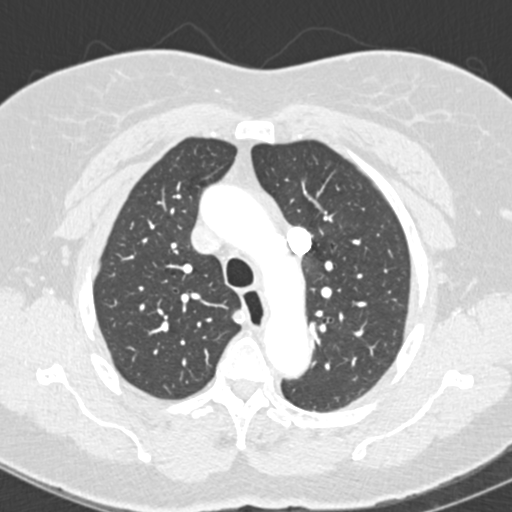
[im 173/231  mediastinal]
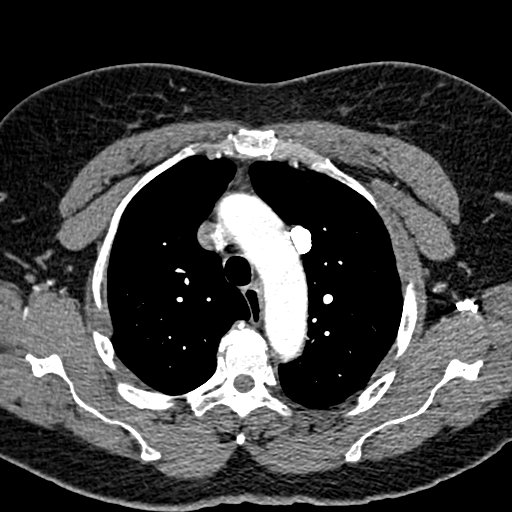
[im 185/231  lung]
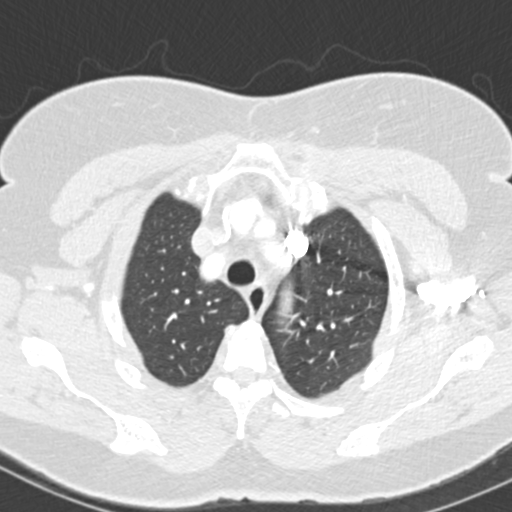
[im 196/231  mediastinal]
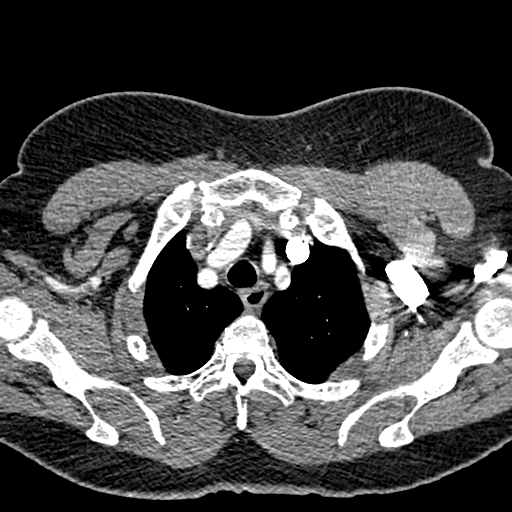
[im 208/231  lung]
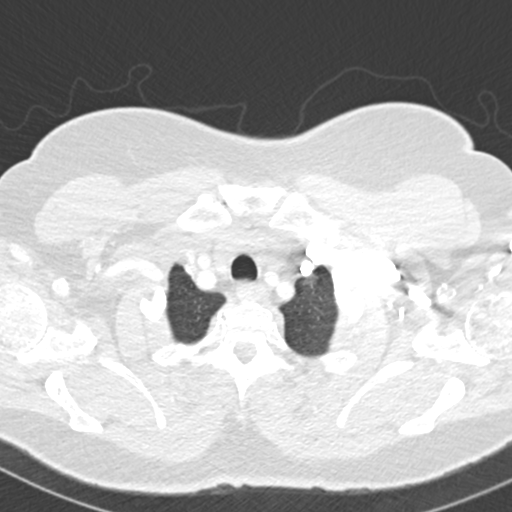
[im 219/231  mediastinal]
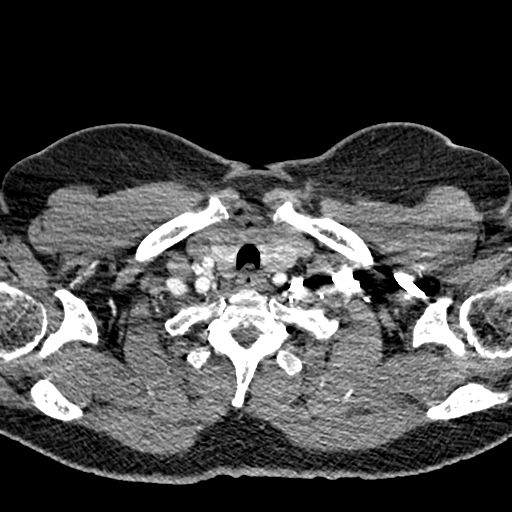

[Series 9: pe 2.0 coronal · coronal · 0.63mm/px · 1 of 116 slices shown]
[im 58/116  mediastinal]
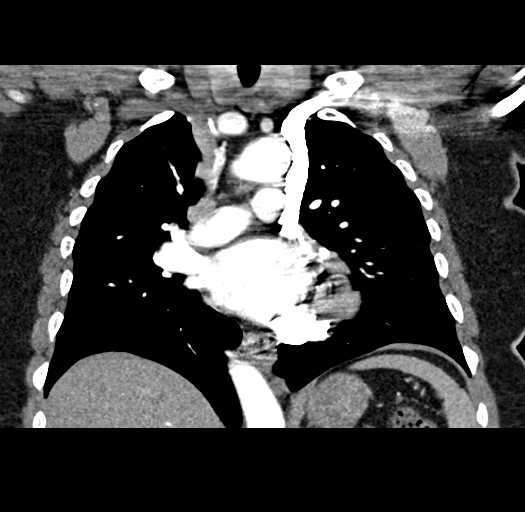

[19 of 36 positions shown; findings below may reference images not displayed]

FINDINGS: No filling defect is identified in the pulmonary arterial
tree to suggest pulmonary embolus.

No acute thoracic aortic findings.  Persistent left SVC drains to
the coronary sinus.

No pathologic thoracic adenopathy.  No reversal of the
interventricular septal contour.  Tortuous thoracic aorta noted.

No pleural effusion identified.  Linear scarring noted in the lower
lobes, right middle lobe, lingula.
IMPRESSION: 1. No filling defect is identified in the pulmonary arterial tree
to suggest pulmonary embolus.
2.  Linear scarring in the lower lobes, right middle lobe, and
lingula.
3.  Incidental persistent left SVC which drains to the coronary
sinus.
4.  Thoracic spondylosis.
5.  Tortuous aorta.

## 2015-02-27 IMAGING — US US EXTREM LOW VENOUS*L*
1 series · 14 of 24 positions shown · non-contrast
Comparison: None.

CLINICAL DATA: Left leg pain, edema

LEFT LOWER EXTREMITY VENOUS DUPLEX ULTRASOUND
TECHNIQUE: Gray-scale sonography with graded compression, as well
as color Doppler and duplex ultrasound were performed to evaluate
the deep venous system of the lower extremity from the level of the
common femoral vein through the popliteal and proximal calf veins.
Spectral Doppler was utilized to evaluate flow at rest and with
distal augmentation maneuvers.

[Series 1: us extrem low venous*left* · 14 of 32 slices shown]
[im 1/32]
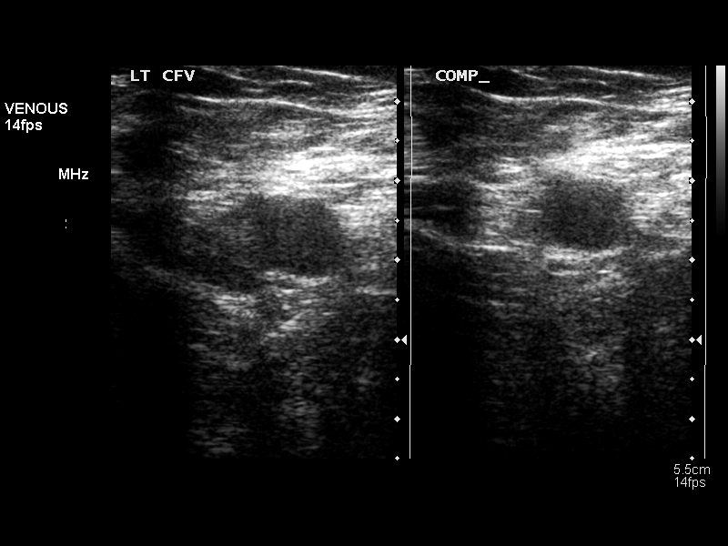
[im 3/32]
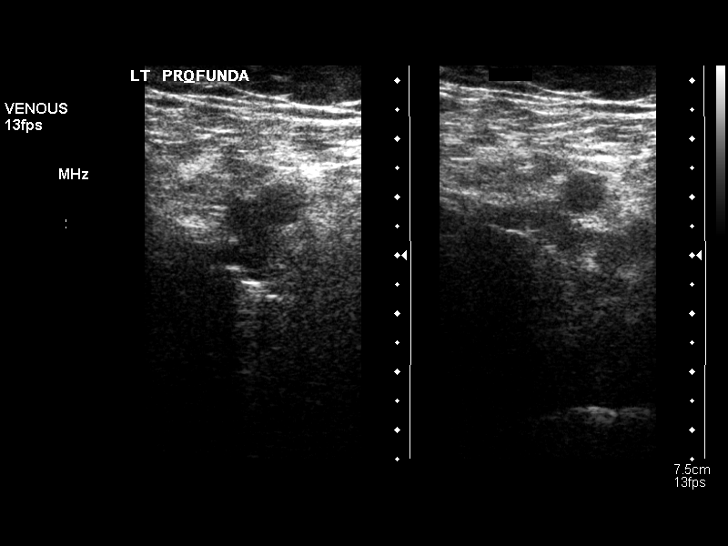
[im 6/32]
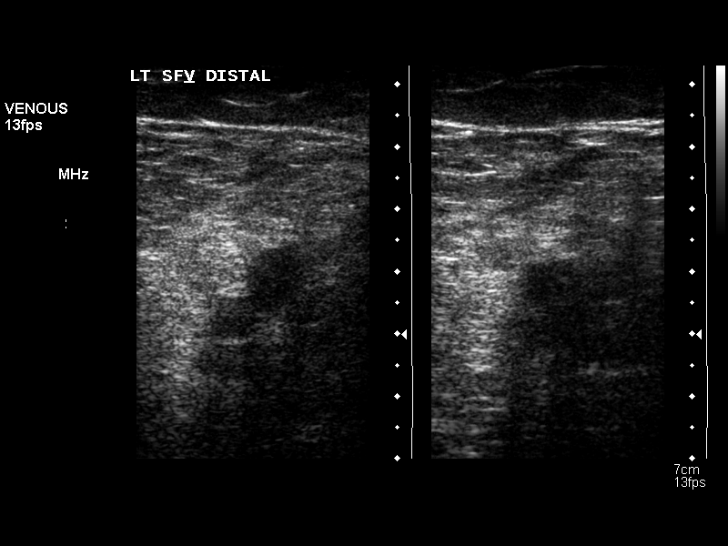
[im 9/32]
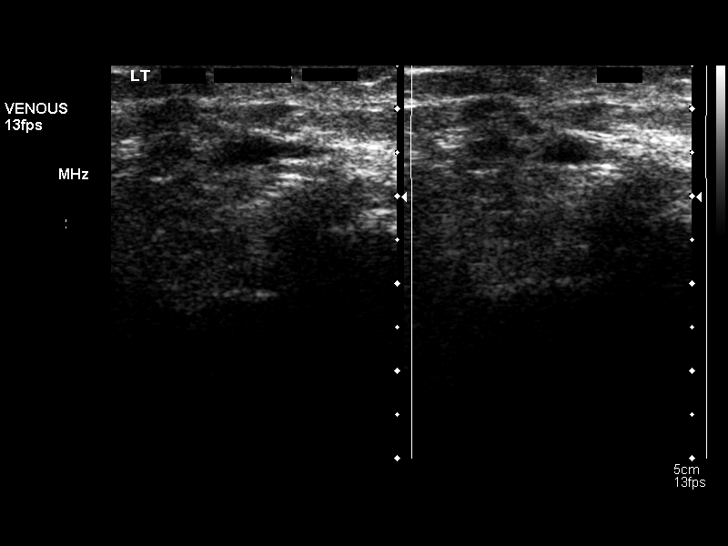
[im 10/32]
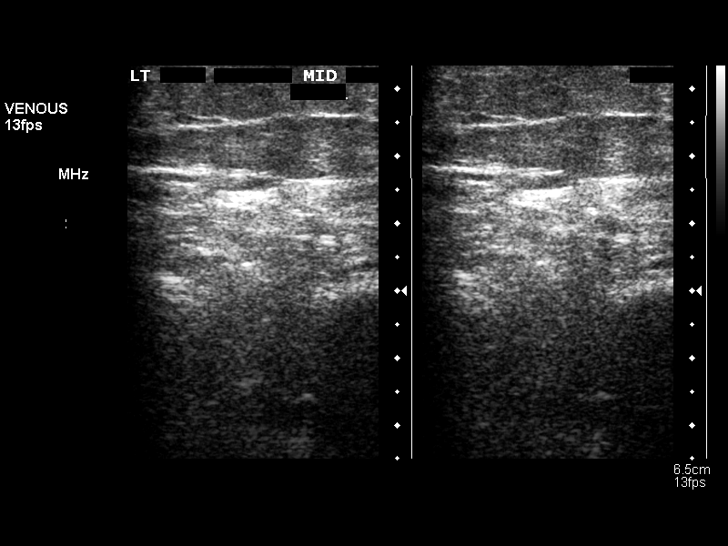
[im 13/32]
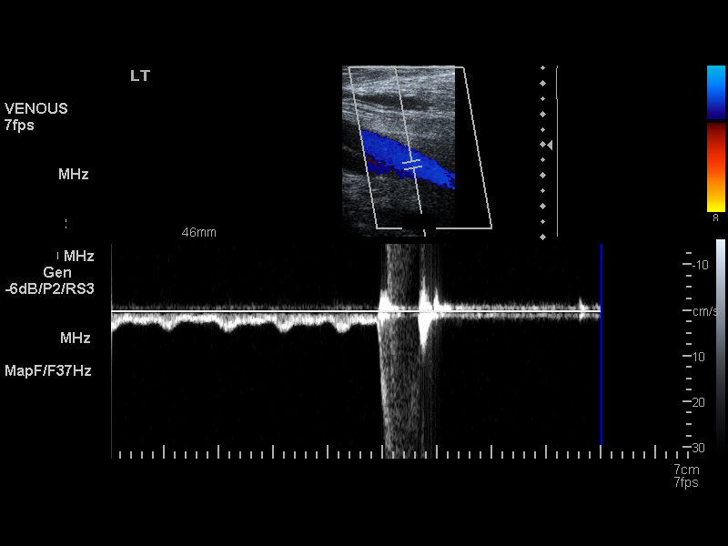
[im 15/32]
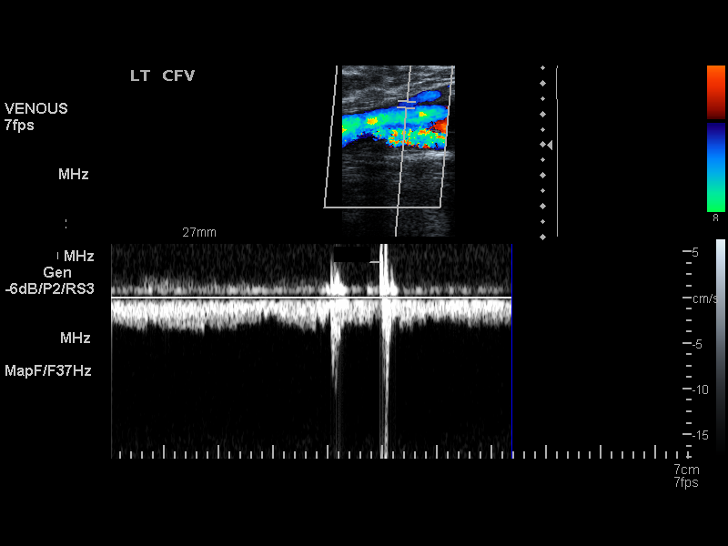
[im 17/32]
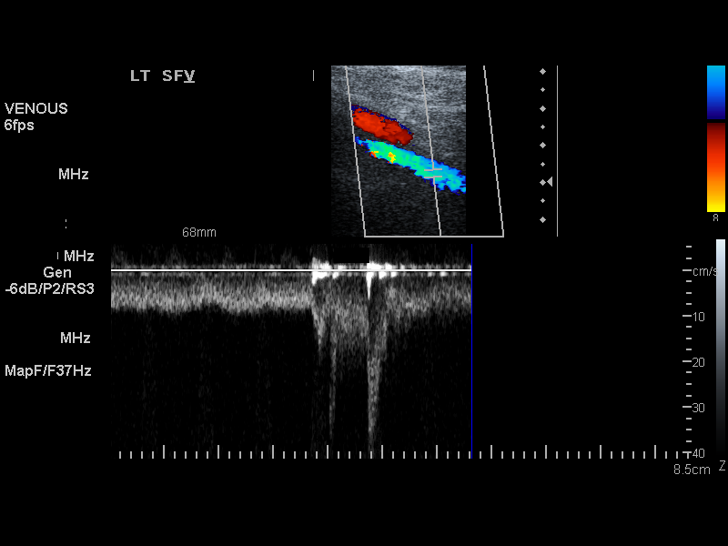
[im 19/32]
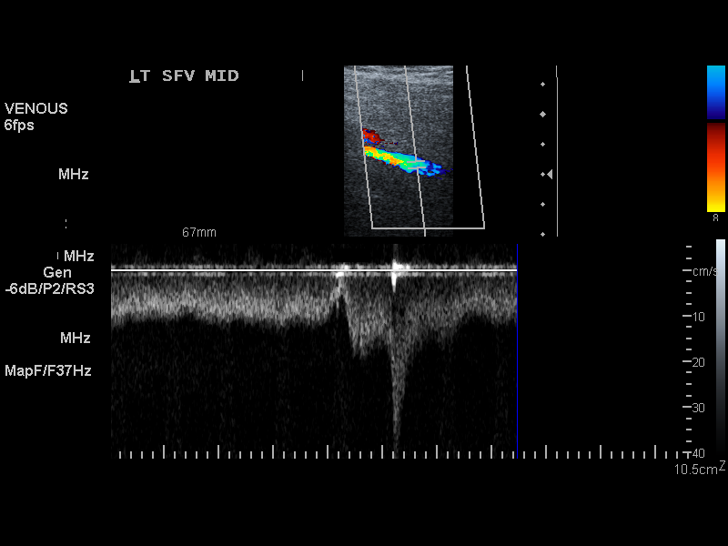
[im 22/32]
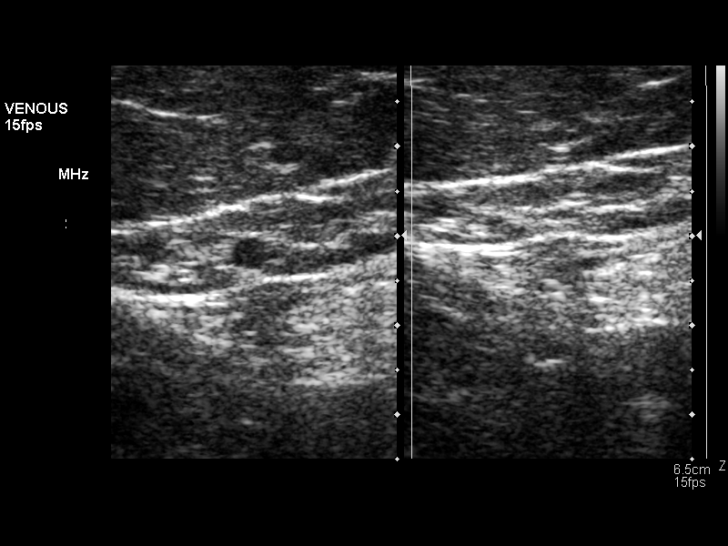
[im 25/32]
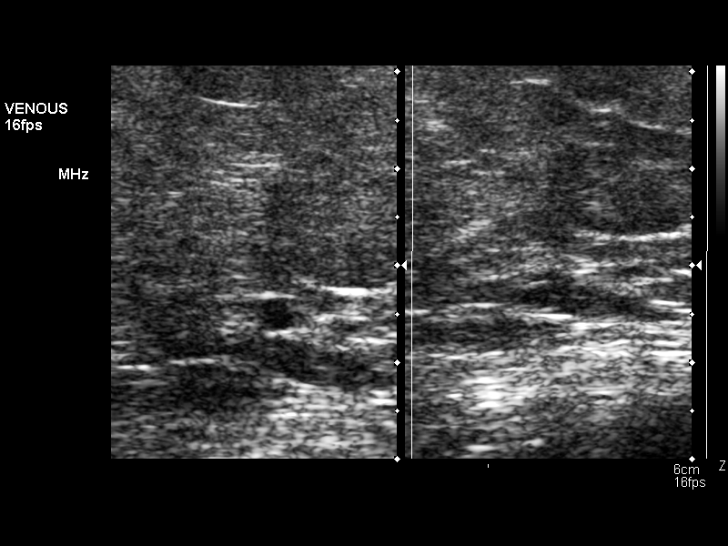
[im 26/32]
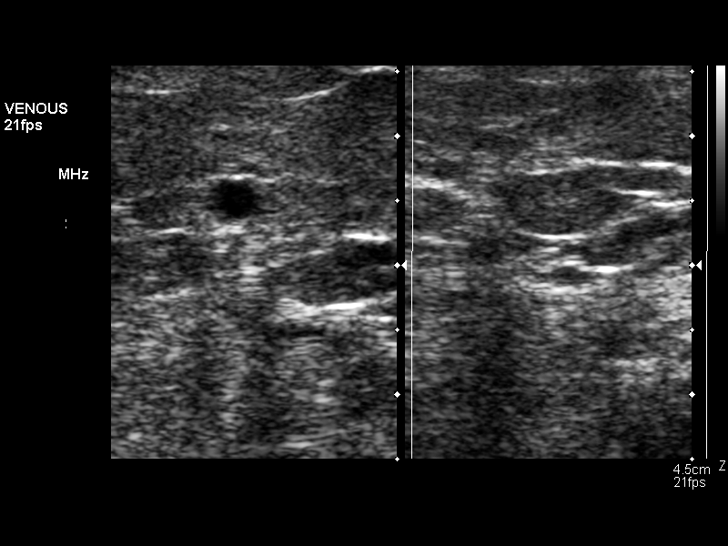
[im 29/32]
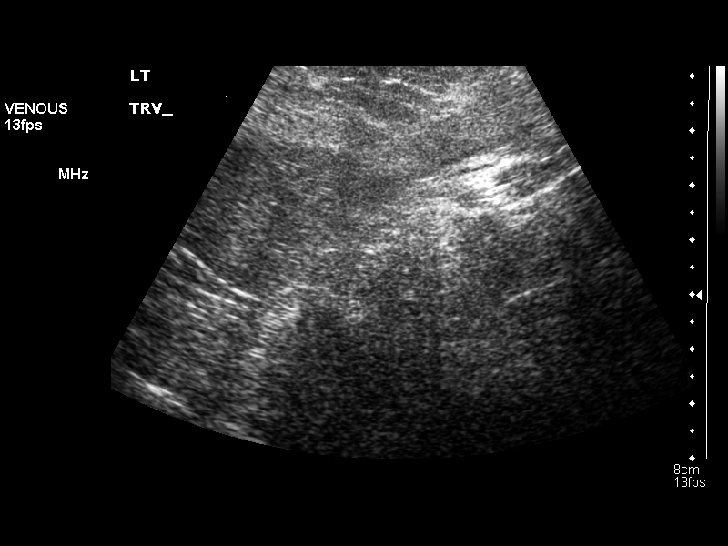
[im 32/32]
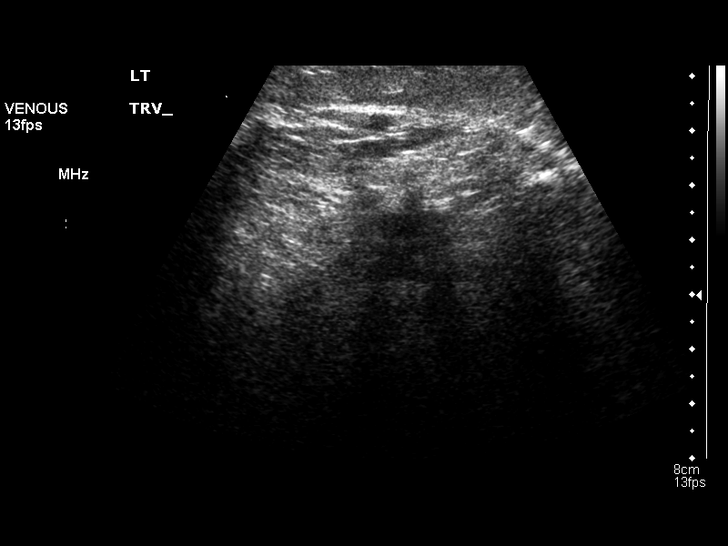

[14 of 24 positions shown; findings below may reference images not displayed]

FINDINGS: Normal compressibility of the common femoral,
superficial femoral, and popliteal veins is demonstrated, as well
as the visualized proximal calf veins.  No filling defects to
suggest DVT on grayscale or color Doppler imaging.  Doppler
waveforms show normal direction of venous flow, normal respiratory
phasicity and response to augmentation.
IMPRESSION: No evidence of lower extremity deep vein thrombosis.

## 2022-12-27 ENCOUNTER — Encounter (HOSPITAL_COMMUNITY): Payer: Self-pay

## 2022-12-27 ENCOUNTER — Other Ambulatory Visit: Payer: Self-pay

## 2022-12-27 ENCOUNTER — Emergency Department (HOSPITAL_COMMUNITY): Payer: Medicare Other

## 2022-12-27 ENCOUNTER — Observation Stay (HOSPITAL_COMMUNITY)
Admission: EM | Admit: 2022-12-27 | Discharge: 2022-12-29 | Disposition: A | Discharge status: Home / Self Care | Payer: Medicare Other | Attending: Internal Medicine | Admitting: Internal Medicine

## 2022-12-27 DIAGNOSIS — Z96652 Presence of left artificial knee joint: Secondary | ICD-10-CM | POA: Diagnosis not present

## 2022-12-27 DIAGNOSIS — Z7982 Long term (current) use of aspirin: Secondary | ICD-10-CM | POA: Insufficient documentation

## 2022-12-27 DIAGNOSIS — E1122 Type 2 diabetes mellitus with diabetic chronic kidney disease: Secondary | ICD-10-CM | POA: Insufficient documentation

## 2022-12-27 DIAGNOSIS — Z87891 Personal history of nicotine dependence: Secondary | ICD-10-CM | POA: Diagnosis not present

## 2022-12-27 DIAGNOSIS — I639 Cerebral infarction, unspecified: Secondary | ICD-10-CM | POA: Diagnosis not present

## 2022-12-27 DIAGNOSIS — N1832 Chronic kidney disease, stage 3b: Secondary | ICD-10-CM | POA: Insufficient documentation

## 2022-12-27 DIAGNOSIS — I10 Essential (primary) hypertension: Secondary | ICD-10-CM | POA: Diagnosis not present

## 2022-12-27 DIAGNOSIS — Z79899 Other long term (current) drug therapy: Secondary | ICD-10-CM | POA: Diagnosis not present

## 2022-12-27 DIAGNOSIS — I129 Hypertensive chronic kidney disease with stage 1 through stage 4 chronic kidney disease, or unspecified chronic kidney disease: Principal | ICD-10-CM | POA: Insufficient documentation

## 2022-12-27 DIAGNOSIS — R519 Headache, unspecified: Secondary | ICD-10-CM | POA: Diagnosis present

## 2022-12-27 DIAGNOSIS — Z7902 Long term (current) use of antithrombotics/antiplatelets: Secondary | ICD-10-CM | POA: Insufficient documentation

## 2022-12-27 LAB — CBC WITH DIFFERENTIAL/PLATELET
Abs Immature Granulocytes: 0 10*3/uL (ref 0.00–0.07)
Basophils Absolute: 0 10*3/uL (ref 0.0–0.1)
Basophils Relative: 1 %
Eosinophils Absolute: 0.1 10*3/uL (ref 0.0–0.5)
Eosinophils Relative: 3 %
HCT: 40.3 % (ref 36.0–46.0)
Hemoglobin: 13.2 g/dL (ref 12.0–15.0)
Immature Granulocytes: 0 %
Lymphocytes Relative: 37 %
Lymphs Abs: 1.7 10*3/uL (ref 0.7–4.0)
MCH: 27.3 pg (ref 26.0–34.0)
MCHC: 32.8 g/dL (ref 30.0–36.0)
MCV: 83.3 fL (ref 80.0–100.0)
Monocytes Absolute: 0.3 10*3/uL (ref 0.1–1.0)
Monocytes Relative: 6 %
Neutro Abs: 2.4 10*3/uL (ref 1.7–7.7)
Neutrophils Relative %: 53 %
Platelets: 236 10*3/uL (ref 150–400)
RBC: 4.84 MIL/uL (ref 3.87–5.11)
RDW: 13.4 % (ref 11.5–15.5)
WBC: 4.5 10*3/uL (ref 4.0–10.5)
nRBC: 0 % (ref 0.0–0.2)

## 2022-12-27 LAB — COMPREHENSIVE METABOLIC PANEL
ALT: 15 U/L (ref 0–44)
AST: 23 U/L (ref 15–41)
Albumin: 3.4 g/dL — ABNORMAL LOW (ref 3.5–5.0)
Alkaline Phosphatase: 96 U/L (ref 38–126)
Anion gap: 9 (ref 5–15)
BUN: 14 mg/dL (ref 8–23)
CO2: 24 mmol/L (ref 22–32)
Calcium: 9.1 mg/dL (ref 8.9–10.3)
Chloride: 106 mmol/L (ref 98–111)
Creatinine, Ser: 1.11 mg/dL — ABNORMAL HIGH (ref 0.44–1.00)
GFR, Estimated: 52 mL/min — ABNORMAL LOW (ref >60)
Glucose, Bld: 93 mg/dL (ref 70–99)
Potassium: 3.7 mmol/L (ref 3.5–5.1)
Sodium: 139 mmol/L (ref 135–145)
Total Bilirubin: 0.5 mg/dL (ref 0.3–1.2)
Total Protein: 7.1 g/dL (ref 6.5–8.1)

## 2022-12-27 LAB — APTT: aPTT: 31 seconds (ref 24–36)

## 2022-12-27 LAB — PROTIME-INR
INR: 1.3 — ABNORMAL HIGH (ref 0.8–1.2)
Prothrombin Time: 15.7 seconds — ABNORMAL HIGH (ref 11.4–15.2)

## 2022-12-27 MED ORDER — LORAZEPAM 2 MG/ML IJ SOLN
2.0000 mg | Freq: Once | INTRAMUSCULAR | Status: AC
  Administered 2022-12-27: 2 mg via INTRAVENOUS
  Filled 2022-12-27: qty 1

## 2022-12-27 MED ORDER — IOHEXOL 350 MG/ML SOLN
75.0000 mL | Freq: Once | INTRAVENOUS | Status: AC | PRN
  Administered 2022-12-27: 75 mL via INTRAVENOUS

## 2022-12-27 NOTE — Consult Note (Incomplete)
NEURO HOSPITALIST CONSULT NOTE   Requestig physician: Dr. Sherry Ruffing  Reason for Consult: Possible subacute strokes on CT  History obtained from:  Patient and Chart     HPI:                                                                                                                                          Julia Tucker is an 77 y.o. female with a PMHx of hepatitis C, HTN, CKD, DM2, HLD and PVD who presented to the hospital in [place] approximately 2 weeks ago with left facial droop and "voice changes" and was diagnosed with a stroke at that time based on clinical criteria per her description, as she also states that her CT scan was negative (she refused MRI during that admission). Chart review reveals that her CTA in [place] was negative for LVO. Bubble echocardiogram was negative for intra-atrial shunt; EF was 65% and there was no thrombus. She was discharged home on ASA and Plavix.   Since then her symptoms have resolved. She presented to the [facility] ED today with acute onset of right frontal 7/10 headache, centered at her forehead above her right eye. She woke up from sleep with the headache. Denied any new visual blurring, tingling, facial droop, dizziness, weakness or slurred speech. She states that she came in as she was worried that the headache may be due to possible new stroke, given her recent stroke diagnosis.   CT scan here was suspicious for a right occipital infarct. MRI was then ordered in conjunction with Neurology consult.   Past Medical History:  Diagnosis Date   Arthritis    Hepatitis    Hx: of Hep C   Hypertension    Kidney stones    Hx: of   Pneumonia    Shortness of breath    Hx: of with exertion   Tachycardia    Hx: of PSVT - Dr. Clarene Critchley and Dr. Adrian Prows [facility])    Past Surgical History:  Procedure Laterality Date   ABDOMINAL HYSTERECTOMY     APPENDECTOMY     BREAST SURGERY     COLONOSCOPY W/ BIOPSIES AND  POLYPECTOMY     Hx; of   CYST EXCISION     Hx: of on chin   FRACTURE SURGERY     plate and 12 screws in Right ankle   JOINT REPLACEMENT     KNEE ARTHROPLASTY Left 07/17/2013   Procedure: COMPUTER ASSISTED TOTAL KNEE ARTHROPLASTY;  Surgeon: Marybelle Killings, MD;  Location: [place];  Service: Orthopedics;  Laterality: Left;  Left Total Knee Arthroplasty, Cemented, Computer Assist   TONSILLECTOMY      Family History  Problem Relation Age of Onset   Hypertension Mother    Cancer - Lung Father  Cancer - Other Sister               Social History:  reports that she has quit smoking. She has never used smokeless tobacco. She reports that she does not drink alcohol and does not use drugs.  Allergies  Allergen Reactions   Clonidine Other (See Comments)    drowsiness   Triamcinolone Acetonide Rash   Prinivil [Lisinopril]     Swelling     HOME MEDICATIONS:                                                                                                                      No current facility-administered medications on file prior to encounter.   Current Outpatient Medications on File Prior to Encounter  Medication Sig Dispense Refill   allopurinol (ZYLOPRIM) 300 MG tablet Take 300 mg by mouth daily.     aspirin EC 81 MG tablet Take 81 mg by mouth daily. Swallow whole.     carvedilol (COREG) 25 MG tablet Take 25 mg by mouth 2 (two) times daily.     clopidogrel (PLAVIX) 75 MG tablet Take 75 mg by mouth daily.     diltiazem (DILACOR XR) 120 MG 24 hr capsule Take 120 mg by mouth daily.     losartan (COZAAR) 100 MG tablet Take 100 mg by mouth daily.     potassium chloride (KLOR-CON) 10 MEQ tablet Take 10 mEq by mouth daily.     rosuvastatin (CRESTOR) 40 MG tablet Take 40 mg by mouth daily.     chlorthalidone (HYGROTON) 25 MG tablet Take 25 mg by mouth daily. (Patient not taking: Reported on 12/28/2022)       ROS:                                                                                                                                        Previously with a 7/10 right frontal headache which has now resolved. Other ROS as per HPI.     Blood pressure (!) 186/91, pulse (!) 53, temperature 98.4 F (36.9 C), temperature source Oral, resp. rate 18, height 5' 7"$  (1.702 m), weight 121.1 kg, SpO2 100 %.   General Examination:  Physical Exam  HEENT-  [place]/AT    Lungs- Respirations unlabored Extremities- No edema   Neurological Examination Mental Status: Alert, oriented x 5, thought content appropriate.  Speech fluent without evidence of aphasia.  Able to follow all commands without difficulty. Cranial Nerves: II: Visual fields intact in all 4 quadrants of each eye tested individually. No extinction to DSS. PERRL  III,IV, VI: No ptosis. EOMI.  V: Temp sensation equal bilaterally  VII: Smile symmetric VIII: Hearing intact to voice IX,X: No hoarseness XI: Symmetric shoulder shrug XII: Midline tongue extension Motor: BUE 5/5 proximally and distally BLE 5/5 proximally and distally  No pronator drift.  Sensory: Temp and light touch intact throughout, bilaterally. No extinction to DSS.  Deep Tendon Reflexes: 1+ and symmetric throughout Cerebellar: No ataxia with FNF bilaterally  Gait: Deferred   Lab Results: Basic Metabolic Panel: Recent Labs  Lab 12/27/22 1551  NA 139  K 3.7  CL 106  CO2 24  GLUCOSE 93  BUN 14  CREATININE 1.11*  CALCIUM 9.1    CBC: Recent Labs  Lab 12/27/22 1551  WBC 4.5  NEUTROABS 2.4  HGB 13.2  HCT 40.3  MCV 83.3  PLT 236    Cardiac Enzymes: No results for input(s): "CKTOTAL", "CKMB", "CKMBINDEX", "TROPONINI" in the last 168 hours.  Lipid Panel: No results for input(s): "CHOL", "TRIG", "HDL", "CHOLHDL", "VLDL", "LDLCALC" in the last 168 hours.  Imaging: CT Head Wo Contrast  Result Date: 12/27/2022 CLINICAL DATA:  Right frontal  headache. EXAM: CT HEAD WITHOUT CONTRAST TECHNIQUE: Contiguous axial images were obtained from the base of the skull through the vertex without intravenous contrast. RADIATION DOSE REDUCTION: This exam was performed according to the departmental dose-optimization program which includes automated exposure control, adjustment of the mA and/or kV according to patient size and/or use of iterative reconstruction technique. COMPARISON:  None Available. FINDINGS: Brain: There is hypodensity with loss of gray-white differentiation in the right occipital lobe which could reflect subacute infarct. There is no evidence of hemorrhage or mass effect. There is no other evidence of acute infarct. There is no acute intracranial hemorrhage or extra-axial fluid collection Background parenchymal volume is normal. The ventricles are normal in size. Gray-white differentiation is otherwise preserved. The pituitary and suprasellar region are normal. There is no mass lesion there is no mass effect or midline shift. Vascular: No hyperdense vessel or unexpected calcification. Skull: Normal. Negative for fracture or focal lesion. Sinuses/Orbits: There is moderate mucosal thickening in the right sphenoid sinus with surrounding hyperostosis consistent with chronic sinusitis. T Bilateral lens implants are in place. The globes and orbits are otherwise unremarkable. e Other: None. IMPRESSION: Possible subacute infarct in the right occipital lobe. Consider MRI for better evaluation. Electronically Signed   By: Valetta Mole M.D.   On: 12/27/2022 18:31     Assessment: 77 year old female with recent left sided stroke symptoms, presenting with acute right frontal headache without additional neurological symptoms.  - Exam reveals no focal neurological deficits. Headache has resolved.  - CT head: Possible subacute infarct in the right occipital lobe.  - MRI brain: There are cortical/subcortical hyperintense T2-weighted signal lesions of the right  frontal operculum and right occipital lobe with subtle DWI correlate at some locations, suggestive of late subacute ischemic infarctions. Possible small amount of blood products associated with the more lateral right occipital lesion. There is multifocal periventricular white matter hyperintensity, most likely secondary to chronic microvascular ischemia. - Her MRI here essentially completes the work up that  was started at [facility] about 2 weeks ago, when she presented there with stroke symptoms. May benefit from telemetry monitoring here.  - She states that her BP at home prior to presenting was 200/105. It is felt that hypertensive urgency was the most likely etiology for her sudden onset headache.   Recommendations: - HgbA1c, fasting lipid panel - No need to repeat CTA or echocardiogram as these were completed during her recent admission to OSH - PT consult, OT consult, Speech consult - Continue ASA,.Plavix and Crestor - Risk factor modification - Telemetry monitoring - Frequent neuro checks - BP management - If headache recurs and BP is not elevated at that time, a trial of Compazine 10 mg IV may be beneficial  - Neurohospitalist service will sign off. Please call if there are additional questions.     Electronically signed: Dr. Kerney Elbe 12/27/2022, 7:49 PM

## 2022-12-27 NOTE — ED Provider Notes (Signed)
[facility] Provider Note   CSN: [id number] Arrival date & time: 12/27/22  1340     History  Chief Complaint  Patient presents with   Headache    Julia Tucker is a 77 y.o. female with a past medical history of TIA last week, hyperlipidemia, hypertension presenting today with concern of a headache.  She reports that she had a right-sided frontal headache that woke her up this morning.  She denies any numbness, tingling or weakness.  No slurred speech, facial droop or any other symptoms today.  She was somewhat concerned because she recently had a TIA diagnosed at an outside facility on Friday.  Headache is coming and going but has pretty much resolved prior to my evaluation.   Headache      Home Medications Prior to Admission medications   Medication Sig Start Date End Date Taking? Authorizing Provider  allopurinol (ZYLOPRIM) 300 MG tablet Take 300 mg by mouth daily.    [provider]  aspirin EC 325 MG tablet Take 1 tablet (325 mg total) by mouth daily. 07/17/13   Phillips Hay, PA-C  atenolol (TENORMIN) 50 MG tablet Take 25 mg by mouth daily.    [provider]  diltiazem (CARDIZEM SR) 120 MG 12 hr capsule Take 120 mg by mouth 2 (two) times daily.    [provider]  furosemide (LASIX) 40 MG tablet Take 40 mg by mouth every morning.    [provider]  ibuprofen (ADVIL,MOTRIN) 600 MG tablet Take 1 tablet (600 mg total) by mouth every 6 (six) hours as needed for pain. 08/12/12   Julianne Rice, MD  losartan (COZAAR) 100 MG tablet Take 100 mg by mouth daily.    [provider]  methocarbamol (ROBAXIN) 500 MG tablet Take 1 tablet (500 mg total) by mouth every 6 (six) hours as needed (spasm). 07/17/13   Phillips Hay, PA-C  naproxen sodium (ANAPROX) 220 MG tablet Take 660 mg by mouth 2 (two) times daily as needed.    [provider]  oxyCODONE-acetaminophen (PERCOCET) 10-325 MG per  tablet Take 1 tablet by mouth every 4 (four) hours as needed for pain.    [provider]  oxyCODONE-acetaminophen (ROXICET) 5-325 MG per tablet Take 1-2 tablets by mouth every 4 (four) hours as needed for pain. 07/17/13   Phillips Hay, PA-C  oxyCODONE-acetaminophen (ROXICET) 5-325 MG per tablet Take 1 tablet by mouth every 4 (four) hours as needed for pain. 07/20/13   Newt Minion, MD  rosuvastatin (CRESTOR) 10 MG tablet Take 10 mg by mouth daily.    [provider]      Allergies    Clonidine, Triamcinolone acetonide, and Prinivil [lisinopril]    Review of Systems   Review of Systems  Neurological:  Positive for headaches.    Physical Exam Updated Vital Signs BP (!) 167/97 (BP Location: Right Arm)   Pulse (!) 56   Temp 98.4 F (36.9 C) (Oral)   Resp 20   Ht 5' 7"$  (1.702 m)   Wt 121.1 kg   SpO2 96%   BMI 41.82 kg/m  Physical Exam Vitals and nursing note reviewed.  Constitutional:      Appearance: Normal appearance.  HENT:     Head: Normocephalic and atraumatic.  Eyes:     General: No visual field deficit or scleral icterus.    Conjunctiva/sclera: Conjunctivae normal.  Cardiovascular:     Rate and Rhythm: Normal rate and regular rhythm.  Pulmonary:     Effort: Pulmonary effort is normal. No respiratory distress.     Breath sounds: No wheezing.  Skin:    Findings: No rash.  Neurological:     Mental Status: She is alert and oriented to person, place, and time.     GCS: GCS eye subscore is 4. GCS verbal subscore is 5. GCS motor subscore is 6.     Cranial Nerves: No cranial nerve deficit, dysarthria or facial asymmetry.     Comments: Completely normal head to toe neurologic exam  Psychiatric:        Mood and Affect: Mood normal.     ED Results / Procedures / Treatments   Labs (all labs ordered are listed, but only abnormal results are displayed) Labs Reviewed  COMPREHENSIVE METABOLIC PANEL - Abnormal; Notable for the following components:       Result Value   Creatinine, Ser 1.11 (*)    Albumin 3.4 (*)    GFR, Estimated 52 (*)    All other components within normal limits  CBC WITH DIFFERENTIAL/PLATELET    EKG None  Radiology CT Head Wo Contrast  Result Date: 12/27/2022 CLINICAL DATA:  Right frontal headache. EXAM: CT HEAD WITHOUT CONTRAST TECHNIQUE: Contiguous axial images were obtained from the base of the skull through the vertex without intravenous contrast. RADIATION DOSE REDUCTION: This exam was performed according to the departmental dose-optimization program which includes automated exposure control, adjustment of the mA and/or kV according to patient size and/or use of iterative reconstruction technique. COMPARISON:  None Available. FINDINGS: Brain: There is hypodensity with loss of gray-white differentiation in the right occipital lobe which could reflect subacute infarct. There is no evidence of hemorrhage or mass effect. There is no other evidence of acute infarct. There is no acute intracranial hemorrhage or extra-axial fluid collection Background parenchymal volume is normal. The ventricles are normal in size. Gray-white differentiation is otherwise preserved. The pituitary and suprasellar region are normal. There is no mass lesion there is no mass effect or midline shift. Vascular: No hyperdense vessel or unexpected calcification. Skull: Normal. Negative for fracture or focal lesion. Sinuses/Orbits: There is moderate mucosal thickening in the right sphenoid sinus with surrounding hyperostosis consistent with chronic sinusitis. T Bilateral lens implants are in place. The globes and orbits are otherwise unremarkable. e Other: None. IMPRESSION: Possible subacute infarct in the right occipital lobe. Consider MRI for better evaluation. Electronically Signed   By: Valetta Mole M.D.   On: 12/27/2022 18:31    Procedures .Critical Care  Performed by: Rhae Hammock, PA-C Authorized by: Rhae Hammock, PA-C   Critical care  provider statement:    Critical care time (minutes):  30   Critical care was necessary to treat or prevent imminent or life-threatening deterioration of the following conditions:  CNS failure or compromise   Critical care was time spent personally by me on the following activities:  Development of treatment plan with patient or surrogate, discussions with consultants, discussions with primary provider, obtaining history from patient or surrogate, ordering and performing treatments and interventions, ordering and review of laboratory studies, ordering and review of radiographic studies, pulse oximetry, re-evaluation of patient's condition and review of old charts   I assumed direction of critical care for this patient from another provider in my specialty: no     Care discussed with: admitting provider      Medications Ordered in ED Medications - No data to display  ED Course/ Medical Decision Making/ A&P  Medical Decision Making Amount and/or Complexity of Data Reviewed Labs: ordered. Radiology: ordered.  Risk Prescription drug management. Decision regarding hospitalization.   77 year old presenting with headache. Emergent considerations for headache include subarachnoid hemorrhage, meningitis, temporal arteritis, glaucoma, cerebral ischemia, carotid/vertebral dissection, intracranial tumor, Venous sinus thrombosis, carbon monoxide poisoning, acute or chronic subdural hemorrhage.   This is not an exhaustive differential.    Past Medical History / Co-morbidities / Social History: TIA last friday   Additional history: Per external chart review patient was seen at [facility] due to a left-sided facial droop.  She had a negative head CT and they believe that she had a TIA.  She was started on Plavix and aspirin.   Physical Exam: Pertinent physical exam findings include Normal  Lab Tests: I ordered, and personally interpreted labs.  The  pertinent results include: No pertinent   Imaging Studies: I ordered and independently visualized and interpreted CT head and I agree with the radiologist that patient has a possible right occipital infarct.  Same infarct was noted on patient's MRI.   Medications: Ativan given for MRI   Consultations Obtained: I spoke with Dr. Cheral Marker with neurology who recommends MRI and CT angiogram.  Also recommends admission.    MDM/Disposition: This is a 77 year old female who had a TIA last Friday who presented today with a headache.  CT scan suspicious for right occipital infarct.  MRI confirms this.  Appears subacute in nature.  I talked to neurology who recommends admission and they will see the patient.  Patient is agreeable to this plan. Dr. Bridgett Larsson to admit.  Final Clinical Impression(s) / ED Diagnoses Final diagnoses:  Cerebrovascular accident (CVA), unspecified mechanism ([place])    Rx / DC Orders ED Discharge Orders     None      Admit to [facility]   Rhae Hammock, PA-C 12/27/22 2125    Tegeler, Gwenyth Allegra, MD 12/27/22 2300

## 2022-12-27 NOTE — ED Provider Triage Note (Signed)
Emergency Medicine Provider Triage Evaluation Note  Julia Tucker , a 77 y.o. female  was evaluated in triage.  Pt complains of headache.  Recent diagnosis of TIA with workup in the outpatient setting.  Patient awoke from sleep this morning She complains of right sided frontal headache which has been persistent and woke her from sleep.  She denies any other focal neurologic deficit, no history of headaches.  Review of Systems  Positive: Headache Negative: weakness  Physical Exam  BP (!) 167/97 (BP Location: Right Arm)   Pulse (!) 56   Temp 98.4 F (36.9 C) (Oral)   Resp 20   Ht 5' 7"$  (1.702 m)   Wt 121.1 kg   SpO2 96%   BMI 41.82 kg/m  Gen:   Awake, no distress   Resp:  Normal effort  MSK:   Moves extremities without difficulty  Other:  No facial droop  Medical Decision Making  Medically screening exam initiated at 3:50 PM.  Appropriate orders placed.  Julia Tucker was informed that the remainder of the evaluation will be completed by another provider, this initial triage assessment does not replace that evaluation, and the importance of remaining in the ED until their evaluation is complete.     Margarita Mail, PA-C 12/27/22 1812

## 2022-12-27 NOTE — ED Notes (Signed)
Patient transported to MRI 

## 2022-12-27 NOTE — ED Triage Notes (Signed)
Reports had a mini stroke last Friday and was seen at [facility].  Reports she initially had left sided facial droop and her voice changed but went back to normal.  Reports right side of her frontal portion woke her up and has been hurting all day.  Denies any other neuro symptom such as slurred speech, dizziness vision changes or unilateral weakness.

## 2022-12-28 ENCOUNTER — Encounter (HOSPITAL_COMMUNITY): Payer: Self-pay | Admitting: Internal Medicine

## 2022-12-28 DIAGNOSIS — N1832 Chronic kidney disease, stage 3b: Secondary | ICD-10-CM | POA: Insufficient documentation

## 2022-12-28 DIAGNOSIS — I639 Cerebral infarction, unspecified: Secondary | ICD-10-CM | POA: Diagnosis not present

## 2022-12-28 DIAGNOSIS — I10 Essential (primary) hypertension: Secondary | ICD-10-CM | POA: Diagnosis present

## 2022-12-28 LAB — URINALYSIS, ROUTINE W REFLEX MICROSCOPIC
Bacteria, UA: NONE SEEN
Bilirubin Urine: NEGATIVE
Glucose, UA: NEGATIVE mg/dL
Ketones, ur: 5 mg/dL — AB
Leukocytes,Ua: NEGATIVE
Nitrite: NEGATIVE
Protein, ur: NEGATIVE mg/dL
Specific Gravity, Urine: 1.021 (ref 1.005–1.030)
pH: 6 (ref 5.0–8.0)

## 2022-12-28 LAB — LIPID PANEL
Cholesterol: 96 mg/dL (ref 0–200)
HDL: 41 mg/dL (ref >40)
LDL Cholesterol: 41 mg/dL (ref 0–99)
Total CHOL/HDL Ratio: 2.3 RATIO
Triglycerides: 71 mg/dL (ref <150)
VLDL: 14 mg/dL (ref 0–40)

## 2022-12-28 MED ORDER — ROSUVASTATIN CALCIUM 20 MG PO TABS
40.0000 mg | ORAL_TABLET | Freq: Every day | ORAL | Status: DC
  Administered 2022-12-29: 40 mg via ORAL
  Filled 2022-12-28 (×2): qty 2

## 2022-12-28 MED ORDER — CHLORTHALIDONE 25 MG PO TABS
25.0000 mg | ORAL_TABLET | Freq: Every day | ORAL | Status: DC
  Administered 2022-12-28 – 2022-12-29 (×2): 25 mg via ORAL
  Filled 2022-12-28 (×2): qty 1

## 2022-12-28 MED ORDER — ASPIRIN 81 MG PO TBEC
81.0000 mg | DELAYED_RELEASE_TABLET | Freq: Every day | ORAL | Status: DC
  Administered 2022-12-28 – 2022-12-29 (×2): 81 mg via ORAL
  Filled 2022-12-28 (×2): qty 1

## 2022-12-28 MED ORDER — ONDANSETRON HCL 4 MG PO TABS: 4.0000 mg | ORAL_TABLET | Freq: Four times a day (QID) | ORAL | Status: DC | PRN

## 2022-12-28 MED ORDER — HEPARIN SODIUM (PORCINE) 5000 UNIT/ML IJ SOLN
5000.0000 [IU] | Freq: Three times a day (TID) | INTRAMUSCULAR | Status: DC
  Administered 2022-12-28 – 2022-12-29 (×6): 5000 [IU] via SUBCUTANEOUS
  Filled 2022-12-28 (×6): qty 1

## 2022-12-28 MED ORDER — LOSARTAN POTASSIUM 50 MG PO TABS
100.0000 mg | ORAL_TABLET | Freq: Every day | ORAL | Status: DC
  Administered 2022-12-28 – 2022-12-29 (×2): 100 mg via ORAL
  Filled 2022-12-28 (×2): qty 2

## 2022-12-28 MED ORDER — ONDANSETRON HCL 4 MG/2ML IJ SOLN: 4.0000 mg | Freq: Four times a day (QID) | INTRAMUSCULAR | Status: DC | PRN

## 2022-12-28 MED ORDER — DILTIAZEM HCL ER COATED BEADS 120 MG PO CP24
120.0000 mg | ORAL_CAPSULE | Freq: Every day | ORAL | Status: DC
  Administered 2022-12-28: 120 mg via ORAL
  Filled 2022-12-28 (×2): qty 1

## 2022-12-28 MED ORDER — ALLOPURINOL 100 MG PO TABS
300.0000 mg | ORAL_TABLET | Freq: Every day | ORAL | Status: DC
  Administered 2022-12-28 – 2022-12-29 (×2): 300 mg via ORAL
  Filled 2022-12-28 (×2): qty 3

## 2022-12-28 MED ORDER — HYDRALAZINE HCL 20 MG/ML IJ SOLN: 10.0000 mg | INTRAMUSCULAR | Status: DC | PRN

## 2022-12-28 MED ORDER — CARVEDILOL 12.5 MG PO TABS
25.0000 mg | ORAL_TABLET | Freq: Every morning | ORAL | Status: DC
  Administered 2022-12-28 – 2022-12-29 (×2): 25 mg via ORAL
  Filled 2022-12-28 (×2): qty 2

## 2022-12-28 MED ORDER — ACETAMINOPHEN 650 MG RE SUPP: 650.0000 mg | Freq: Four times a day (QID) | RECTAL | Status: DC | PRN

## 2022-12-28 MED ORDER — ACETAMINOPHEN 325 MG PO TABS
650.0000 mg | ORAL_TABLET | Freq: Four times a day (QID) | ORAL | Status: DC | PRN
  Administered 2022-12-28 – 2022-12-29 (×2): 650 mg via ORAL
  Filled 2022-12-28 (×2): qty 2

## 2022-12-28 MED ORDER — CLOPIDOGREL BISULFATE 75 MG PO TABS
75.0000 mg | ORAL_TABLET | Freq: Every day | ORAL | Status: DC
  Administered 2022-12-28 – 2022-12-29 (×2): 75 mg via ORAL
  Filled 2022-12-28 (×2): qty 1

## 2022-12-28 MED ORDER — LABETALOL HCL 5 MG/ML IV SOLN
10.0000 mg | Freq: Once | INTRAVENOUS | Status: AC
  Administered 2022-12-28: 10 mg via INTRAVENOUS
  Filled 2022-12-28: qty 4

## 2022-12-28 NOTE — Assessment & Plan Note (Addendum)
This happened on 12-16-2022 when she was admitted to [facility] medical center. Pt had left facial droop. Pt refused MRI at that time. Today's MRI shows subacute CVA. Pt had CTA that showed: CTA HEAD NECK  1.  No large vessel occlusion identified.  2.  No aneurysm or dissection identified.  3.  Enlarged complex left thyroid lobe nodule measuring up to 4 cm.   Echo at [facility] was negative for thrombosis. Bubble study negative for shunt  Left Ventricle Left ventricle size is normal. There is severe concentric  hypertrophy. EF: 60-65%. Quantitative analysis of left ventricular Global  Longitudinal Strain (GLS) imaging is -17.300%. No regional wall motion  abnormalities noted. Doppler parameters consistent with restrictive  filling pattern and markedly elevated LA pressure.  Right Ventricle Right ventricle size is normal. Systolic function is  normal.  Left Atrium Left atrium is moderately dilated at 4.500 cmLeft atrium  volume index is moderately increased (42-48 mL/m2). Atrial septum appears  intact. Injection of agitated saline documents no interatrial shunt.  Right Atrium Right atrium is mildly dilated.  IVC/SVC The inferior vena cava demonstrates a diameter of <=2.1 cm and  collapses >50%; therefore, the right atrial pressure is estimated at 3  mmHg.  Mitral Valve The leaflets are mildly thickened. There is mild  regurgitation.  Tricuspid Valve Tricuspid valve structure is normal. There is trace  regurgitation. The right ventricular systolic pressure is normal (<36  mmHg).  Aortic Valve The aortic valve is tricuspid. The leaflets exhibit normal  excursion. The leaflets are calcified. There is sclerosis. Trace aortic  valve regurgitation. There is no evidence of aortic valve stenosis.  Pulmonic Valve The pulmonic valve was not well visualized. Trace  regurgitation. There is no evidence of pulmonic valve stenosis.  Ascending Aorta The aortic root is normal in size. The ascending  aorta is  normal in size.  Pericardium There is a trivial pericardial effusion noted.  Left Ventricle  Left ventricle size is normal. There is severe concentric hypertrophy. EF: 60-65%. Quantitative analysis of left ventricular Global Longitudinal Strain (GLS) imaging is -17.300%. No regional wall motion abnormalities noted. Doppler parameters consistent with restrictive filling pattern and markedly elevated LA pressure.   Right Ventricle  Right ventricle size is normal. Systolic function is normal.   Left Atrium  Left atrium is moderately dilated at 4.500 cmLeft atrium volume index is moderately increased (42-48 mL/m2). Atrial septum appears intact. Injection of agitated saline documents no interatrial shunt.   Right Atrium  Right atrium is mildly dilated.   IVC/SVC  The inferior vena cava demonstrates a diameter of <=2.1 cm and collapses >50%; therefore, the right atrial pressure is estimated at 3 mmHg.   Mitral Valve  The leaflets are mildly thickened. There is mild regurgitation.   Tricuspid Valve  Tricuspid valve structure is normal. There is trace regurgitation. The right ventricular systolic pressure is normal (<36 mmHg).   Aortic Valve  The aortic valve is tricuspid. The leaflets exhibit normal excursion. The leaflets are calcified. There is sclerosis. Trace aortic valve regurgitation. There is no evidence of aortic valve stenosis.   Pulmonic Valve  The pulmonic valve was not well visualized. Trace regurgitation. There is no evidence of pulmonic valve stenosis.   Ascending Aorta  The aortic root is normal in size. The ascending aorta is normal in size.   Pericardium  There is a trivial pericardial effusion noted.   Lipid panel showed:  Ref Range & Units 11 d ago   CHOLESTEROL TOTAL 100 -  199 mg/dL 128  Trig 0 - 149 mg/dL 100  HDL >=39 mg/dL 48  LDL 0 - 99 mg/dL 60  VLDL 5 - 40 mg/dl 20  CHOL/HDL 0 - 5 3    Pt does not have any issues with walking or swallowing.  Given that her CVA on MRI brain is subacute, she need no further workup. Pt already on ASA 81 mg and plavix 75 mg. Pt already on crestor 40 mg

## 2022-12-28 NOTE — Progress Notes (Signed)
[facility] PROGRESS NOTE    Progress Note  BRINLY SHUTLER  [id number] DOB: [birth year] DOA: 12/27/2022 PCP: [place]     Brief Narrative:   Julia Tucker is an 77 y.o. female past medical history of uncontrolled hypertension, chronic kidney disease stage IIIb, with a creatinine of approximately 1.5, diabetes mellitus type 2 noncompliant with her medication, peripheral vascular disease comes to the ED with a headache recently discharged from [facility] on 12/16/2022 for stroke workup was negative except for MRI brain was not performed as the patient refused she was discharged home on aspirin and Plavix she was continued on Crestor, Coreg, Cardizem, hydrochlorothiazide, losartan comes into the ED today with headache, CT of the head here at [facility] showed a subacute right occipital infarct, MRI of the brain showed subacute confirmation of the right occipital infarct, CTA was negative for LVO.  Neurology was consulted    Assessment/Plan:   Uncontrolled hypertension She relates she took all of her meds this morning.  Coreg 25, Cardizem 120, chlorthalidone 25. Blood pressure this morning has been ranging from 119/68 to 145/90. Restart her ARB this morning.  History of CVA: This happened back in 12/16/2022. MRI of the brain today confirms subacute CVA. Will continue aspirin and Plavix. HgbA1c pending, fasting lipid panel HDL >40, LDL 41. MRI subcortical right frontal operculum and right occipital lobe. PT, OT, Speech consult  Start patient on ASA 69m daily and plavix 71mdaily and crestor. Telemetry monitoring  Chronic kidney disease, stage 3b (HCC) - baseline SCr 1.45 Creatinine seems at baseline.   DVT prophylaxis: Lovenox Family Communication:none Status is: Observation The patient remains OBS appropriate and will d/c before 2 midnights.    Code Status:     Code Status Orders  (From admission, onward)           Start      Ordered   12/28/22 0046  Full code  Continuous       Question:  By:  Answer:  Consent: discussion documented in EHR   12/28/22 0046           Code Status History     Date Active Date Inactive Code Status Order ID Comments User Context   12/28/2022 0026 12/28/2022 0046 Full Code [id number] OppenheimDO ED         IV Access:   Peripheral IV   Procedures and diagnostic studies:   CT ANGIO HEAD NECK W WO CM  Result Date: 12/27/2022 CLINICAL DATA:  Acute neurologic deficit EXAM: CT ANGIOGRAPHY HEAD AND NECK TECHNIQUE: Multidetector CT imaging of the head and neck was performed using the standard protocol during bolus administration of intravenous contrast. Multiplanar CT image reconstructions and MIPs were obtained to evaluate the vascular anatomy. Carotid stenosis measurements (when applicable) are obtained utilizing NASCET criteria, using the distal internal carotid diameter as the denominator. RADIATION DOSE REDUCTION: This exam was performed according to the departmental dose-optimization program which includes automated exposure control, adjustment of the mA and/or kV according to patient size and/or use of iterative reconstruction technique. CONTRAST:  7515mMNIPAQUE IOHEXOL 350 MG/ML SOLN COMPARISON:  Brain MRI 12/27/2022 FINDINGS: CTA NECK FINDINGS SKELETON: There is no bony spinal canal stenosis. No lytic or blastic lesion. OTHER NECK: Enlarged and heterogeneous thyroid gland. UPPER CHEST: Small pleural effusions. AORTIC ARCH: There is calcific atherosclerosis of the aortic arch. There is no aneurysm, dissection or hemodynamically significant stenosis of the visualized portion of the aorta. Conventional 3  vessel aortic branching pattern. The visualized proximal subclavian arteries are widely patent. RIGHT CAROTID SYSTEM: Normal without aneurysm, dissection or stenosis. LEFT CAROTID SYSTEM: Normal without aneurysm, dissection or stenosis. VERTEBRAL ARTERIES: Left dominant  configuration. Both origins are clearly patent. There is no dissection, occlusion or flow-limiting stenosis to the skull base (V1-V3 segments). CTA HEAD FINDINGS POSTERIOR CIRCULATION: --Vertebral arteries: Normal V4 segments. --Inferior cerebellar arteries: Normal. --Basilar artery: Normal. --Superior cerebellar arteries: Normal. --Posterior cerebral arteries (PCA): Normal. ANTERIOR CIRCULATION: --Intracranial internal carotid arteries: Normal. --Anterior cerebral arteries (ACA): Normal. Both A1 segments are present. Patent anterior communicating artery (a-comm). --Middle cerebral arteries (MCA): Normal. VENOUS SINUSES: As permitted by contrast timing, patent. ANATOMIC VARIANTS: None Review of the MIP images confirms the above findings. There is contrast enhancement at the areas of abnormal hyperintense T2-weighted signal on the earlier MRI, compatible with subacute infarcts. IMPRESSION: 1. No emergent large vessel occlusion or hemodynamically significant stenosis. 2. Leptomeningeal contrast enhancement in the right frontal operculum and right occipital lobe, compatible with subacute infarcts. 3. Small pleural effusions. 4. Incidental heterogeneous and enlarged thyroid. Recommend non-emergent thyroid ultrasound. Reference: J Am Coll Radiol. 2015 Feb;12(2): 143-50 Electronically Signed   By: Ulyses Jarred M.D.   On: 12/27/2022 21:46   MR BRAIN WO CONTRAST  Result Date: 12/27/2022 CLINICAL DATA:  Acute neurologic deficit EXAM: MRI HEAD WITHOUT CONTRAST TECHNIQUE: Multiplanar, multiecho pulse sequences of the brain and surrounding structures were obtained without intravenous contrast. COMPARISON:  None Available. FINDINGS: Brain: No acute infarct, mass effect or extra-axial collection. There are cortical/subcortical hyperintense T2-weighted signal lesions of the right frontal operculum and right occipital lobe. Possible small amount of blood products associated with the more lateral right occipital lesion. CSF  spaces are normal. There is multifocal periventricular white matter hyperintensity, most often a result of chronic microvascular ischemia. The midline structures are normal. Vascular: Normal flow voids. Skull and upper cervical spine: Normal marrow signal. Sinuses/Orbits: Negative. Other: None. IMPRESSION: Cortical/subcortical lesions of the right frontal operculum and right occipital lobe. In the provided context, these are favored to represent subacute infarcts. Electronically Signed   By: Ulyses Jarred M.D.   On: 12/27/2022 21:12   CT Head Wo Contrast  Result Date: 12/27/2022 CLINICAL DATA:  Right frontal headache. EXAM: CT HEAD WITHOUT CONTRAST TECHNIQUE: Contiguous axial images were obtained from the base of the skull through the vertex without intravenous contrast. RADIATION DOSE REDUCTION: This exam was performed according to the departmental dose-optimization program which includes automated exposure control, adjustment of the mA and/or kV according to patient size and/or use of iterative reconstruction technique. COMPARISON:  None Available. FINDINGS: Brain: There is hypodensity with loss of gray-white differentiation in the right occipital lobe which could reflect subacute infarct. There is no evidence of hemorrhage or mass effect. There is no other evidence of acute infarct. There is no acute intracranial hemorrhage or extra-axial fluid collection Background parenchymal volume is normal. The ventricles are normal in size. Gray-white differentiation is otherwise preserved. The pituitary and suprasellar region are normal. There is no mass lesion there is no mass effect or midline shift. Vascular: No hyperdense vessel or unexpected calcification. Skull: Normal. Negative for fracture or focal lesion. Sinuses/Orbits: There is moderate mucosal thickening in the right sphenoid sinus with surrounding hyperostosis consistent with chronic sinusitis. T Bilateral lens implants are in place. The globes and orbits  are otherwise unremarkable. e Other: None. IMPRESSION: Possible subacute infarct in the right occipital lobe. Consider MRI for better evaluation. Electronically Signed  By: Valetta Mole M.D.   On: 12/27/2022 18:31     Medical Consultants:   None.   Subjective:    LADINE CARDOZO relates her headaches are improved.  Objective:    Vitals:   12/28/22 0215 12/28/22 0500 12/28/22 0545 12/28/22 0557  BP:  119/68 131/79   Pulse:  (!) 56 (!) 55   Resp:  12 17   Temp: 98.1 F (36.7 C)   98.3 F (36.8 C)  TempSrc: Oral   Oral  SpO2:  93% 95%   Weight:      Height:       SpO2: 95 %  No intake or output data in the 24 hours ending 12/28/22 0645 Filed Weights   12/27/22 1432  Weight: 121.1 kg    Exam: General exam: In no acute distress. Respiratory system: Good air movement and clear to auscultation. Cardiovascular system: S1 & S2 heard, RRR. No JVD. Gastrointestinal system: Abdomen is nondistended, soft and nontender.  Extremities: No pedal edema. Skin: No rashes, lesions or ulcers Psychiatry: Judgement and insight appear normal. Mood & affect appropriate.    Data Reviewed:    Labs: Basic Metabolic Panel: Recent Labs  Lab 12/27/22 1551  NA 139  K 3.7  CL 106  CO2 24  GLUCOSE 93  BUN 14  CREATININE 1.11*  CALCIUM 9.1   GFR Estimated Creatinine Clearance: 59 mL/min (A) (by C-G formula based on SCr of 1.11 mg/dL (H)). Liver Function Tests: Recent Labs  Lab 12/27/22 1551  AST 23  ALT 15  ALKPHOS 96  BILITOT 0.5  PROT 7.1  ALBUMIN 3.4*   No results for input(s): "LIPASE", "AMYLASE" in the last 168 hours. No results for input(s): "AMMONIA" in the last 168 hours. Coagulation profile Recent Labs  Lab 12/27/22 2249  INR 1.3*   COVID-19 Labs  No results for input(s): "DDIMER", "FERRITIN", "LDH", "CRP" in the last 72 hours.  No results found for: "SARSCOV2NAA"  CBC: Recent Labs  Lab 12/27/22 1551  WBC 4.5  NEUTROABS 2.4  HGB 13.2  HCT 40.3   MCV 83.3  PLT 236   Cardiac Enzymes: No results for input(s): "CKTOTAL", "CKMB", "CKMBINDEX", "TROPONINI" in the last 168 hours. BNP (last 3 results) No results for input(s): "PROBNP" in the last 8760 hours. CBG: No results for input(s): "GLUCAP" in the last 168 hours. D-Dimer: No results for input(s): "DDIMER" in the last 72 hours. Hgb A1c: No results for input(s): "HGBA1C" in the last 72 hours. Lipid Profile: Recent Labs    12/27/22 1551  CHOL 96  HDL 41  LDLCALC 41  TRIG 71  CHOLHDL 2.3   Thyroid function studies: No results for input(s): "TSH", "T4TOTAL", "T3FREE", "THYROIDAB" in the last 72 hours.  Invalid input(s): "FREET3" Anemia work up: No results for input(s): "VITAMINB12", "FOLATE", "FERRITIN", "TIBC", "IRON", "RETICCTPCT" in the last 72 hours. Sepsis Labs: Recent Labs  Lab 12/27/22 1551  WBC 4.5   Microbiology No results found for this or any previous visit (from the past 240 hour(s)).   Medications:    allopurinol  300 mg Oral Daily   aspirin EC  81 mg Oral Daily   carvedilol  25 mg Oral q AM   chlorthalidone  25 mg Oral Daily   clopidogrel  75 mg Oral Daily   diltiazem  120 mg Oral QHS   heparin  5,000 Units Subcutaneous Q8H   losartan  100 mg Oral Q1200   rosuvastatin  40 mg Oral Daily  Continuous Infusions:    LOS: 0 days   Charlynne Cousins  [facility]  12/28/2022, 6:45 AM

## 2022-12-28 NOTE — Subjective & Objective (Signed)
CC: headache HPI: 77 year old African-American female history of uncontrolled hypertension, CKD stage IIIb baseline creatinine approximately 1.5, type 2 diabetes not taking current medications, hyperlipidemia, peripheral vascular disease, presents to the ER today with headache.  She was admitted to [facility] on 12/16/2022 for stroke.  She had left facial droop at that time.  CT head was negative for stroke.  CT angiogram was negative for large vessel occlusion.  Bubble echocardiogram was negative for intra-atrial shunt.  EF of 65%.  There is no thrombus.  Patient refused MRI despite being offered sedation.  She was discharged home on aspirin, Plavix.  She was continued on her Crestor.  She was discharged home on her Coreg 25 mg, Cardizem 120 mg, chlorthalidone 25 mg and losartan 100 mg.  Patient states that she takes all of her blood pressure medications in the morning including Coreg, chlorthalidone, diltiazem, Cozaar.  She presented to the ER today with complaints of headache.  CT head showed subacute infarct in the right occipital lobe.  This was confirmed on MRI that showed subacute infarct in the right occipital lobe.  CT angio was negative for large vessel occlusion.  EDP is consulted neurology.  [facility] contacted for admission.

## 2022-12-28 NOTE — ED Notes (Signed)
ED TO INPATIENT HANDOFF REPORT  ED Nurse Name and Phone #: cori [phone]  S Name/Age/Gender Julia Tucker 77 y.o. female Room/Bed: 039C/039C  Code Status   Code Status: Full Code  Home/SNF/Other Home Patient oriented to: self, place, time, and situation Is this baseline? Yes   Triage Complete: Triage complete  Chief Complaint Uncontrolled hypertension [I10]  Triage Note Reports had a mini stroke last Friday and was seen at [facility].  Reports she initially had left sided facial droop and her voice changed but went back to normal.  Reports right side of her frontal portion woke her up and has been hurting all day.  Denies any other neuro symptom such as slurred speech, dizziness vision changes or unilateral weakness.    Allergies Allergies  Allergen Reactions   Clonidine Other (See Comments)    drowsiness   Triamcinolone Acetonide Rash   Prinivil [Lisinopril]     Swelling     Level of Care/Admitting Diagnosis ED Disposition     ED Disposition  Admit   Condition  --   Comment  Hospital Area: [place] [[id number]]  Level of Care: Telemetry Medical [104]  May place patient in observation at [facility] or [place] if equivalent level of care is available:: Yes  Covid Evaluation: Asymptomatic - no recent exposure (last 10 days) testing not required  Diagnosis: Uncontrolled hypertension [id number]  Admitting Physician: Bridgett Larsson, [place]  Attending Physician: Bridgett Larsson, [place] [[id number]]          B Medical/Surgery History Past Medical History:  Diagnosis Date   Arthritis    Hepatitis    Hx: of Hep C   Hypertension    Kidney stones    Hx: of   Pneumonia    Shortness of breath    Hx: of with exertion   Tachycardia    Hx: of PSVT - Dr. Clarene Critchley and Dr. Adrian Prows ([facility])   Past Surgical History:  Procedure Laterality Date   ABDOMINAL HYSTERECTOMY     APPENDECTOMY     BREAST SURGERY     COLONOSCOPY W/ BIOPSIES AND POLYPECTOMY      Hx; of   CYST EXCISION     Hx: of on chin   FRACTURE SURGERY     plate and 12 screws in Right ankle   JOINT REPLACEMENT     KNEE ARTHROPLASTY Left 07/17/2013   Procedure: COMPUTER ASSISTED TOTAL KNEE ARTHROPLASTY;  Surgeon: Marybelle Killings, MD;  Location: [place];  Service: Orthopedics;  Laterality: Left;  Left Total Knee Arthroplasty, Cemented, Computer Assist   TONSILLECTOMY       A IV Location/Drains/Wounds Patient Lines/Drains/Airways Status     Active Line/Drains/Airways     Name Placement date Placement time Site Days   Peripheral IV 12/27/22 20 G Anterior;Distal;Left Forearm 12/27/22  2008  Forearm  1   Incision 07/17/13 Knee Left 07/17/13  1451  -- 3451            Intake/Output Last 24 hours No intake or output data in the 24 hours ending 12/28/22 1420  Labs/Imaging Results for orders placed or performed during the hospital encounter of 12/27/22 (from the past 48 hour(s))  CBC with Differential     Status: None   Collection Time: 12/27/22  3:51 PM  Result Value Ref Range   WBC 4.5 4.0 - 10.5 K/uL   RBC 4.84 3.87 - 5.11 MIL/uL   Hemoglobin 13.2 12.0 - 15.0 g/dL   HCT 40.3 36.0 -  46.0 %   MCV 83.3 80.0 - 100.0 fL   MCH 27.3 26.0 - 34.0 pg   MCHC 32.8 30.0 - 36.0 g/dL   RDW 13.4 11.5 - 15.5 %   Platelets 236 150 - 400 K/uL   nRBC 0.0 0.0 - 0.2 %   Neutrophils Relative % 53 %   Neutro Abs 2.4 1.7 - 7.7 K/uL   Lymphocytes Relative 37 %   Lymphs Abs 1.7 0.7 - 4.0 K/uL   Monocytes Relative 6 %   Monocytes Absolute 0.3 0.1 - 1.0 K/uL   Eosinophils Relative 3 %   Eosinophils Absolute 0.1 0.0 - 0.5 K/uL   Basophils Relative 1 %   Basophils Absolute 0.0 0.0 - 0.1 K/uL   Immature Granulocytes 0 %   Abs Immature Granulocytes 0.00 0.00 - 0.07 K/uL    Comment: Performed at [facility] [address], [place], [place] [address]  Comprehensive metabolic panel     Status: Abnormal   Collection Time: 12/27/22  3:51 PM  Result Value Ref Range   Sodium 139 135 -  145 mmol/L   Potassium 3.7 3.5 - 5.1 mmol/L   Chloride 106 98 - 111 mmol/L   CO2 24 22 - 32 mmol/L   Glucose, Bld 93 70 - 99 mg/dL    Comment: Glucose reference range applies only to samples taken after fasting for at least 8 hours.   BUN 14 8 - 23 mg/dL   Creatinine, Ser 1.11 (H) 0.44 - 1.00 mg/dL   Calcium 9.1 8.9 - 10.3 mg/dL   Total Protein 7.1 6.5 - 8.1 g/dL   Albumin 3.4 (L) 3.5 - 5.0 g/dL   AST 23 15 - 41 U/L   ALT 15 0 - 44 U/L   Alkaline Phosphatase 96 38 - 126 U/L   Total Bilirubin 0.5 0.3 - 1.2 mg/dL   GFR, Estimated 52 (L) >60 mL/min    Comment: (NOTE) Calculated using the CKD-EPI Creatinine Equation (2021)    Anion gap 9 5 - 15    Comment: Performed at [facility] [address] [place], [place] [address]  Lipid panel     Status: None   Collection Time: 12/27/22  3:51 PM  Result Value Ref Range   Cholesterol 96 0 - 200 mg/dL   Triglycerides 71 <150 mg/dL   HDL 41 >40 mg/dL   Total CHOL/HDL Ratio 2.3 RATIO   VLDL 14 0 - 40 mg/dL   LDL Cholesterol 41 0 - 99 mg/dL    Comment:        Total Cholesterol/HDL:CHD Risk Coronary Heart Disease Risk Table                     Men   Women  1/2 Average Risk   3.4   3.3  Average Risk       5.0   4.4  2 X Average Risk   9.6   7.1  3 X Average Risk  23.4   11.0        Use the calculated Patient Ratio above and the CHD Risk Table to determine the patient's CHD Risk.        ATP III CLASSIFICATION (LDL):  <100     mg/dL   Optimal  100-129  mg/dL   Near or Above                    Optimal  130-159  mg/dL  Borderline  160-189  mg/dL   High  >190     mg/dL   Very High Performed at [facility] [address], [place], [place] [address]   Protime-INR     Status: Abnormal   Collection Time: 12/27/22 10:49 PM  Result Value Ref Range   Prothrombin Time 15.7 (H) 11.4 - 15.2 seconds   INR 1.3 (H) 0.8 - 1.2    Comment: (NOTE) INR goal varies based on device and disease states. Performed at [facility], [place] [address] [place], [place] [address]   APTT     Status: None   Collection Time: 12/27/22 10:49 PM  Result Value Ref Range   aPTT 31 24 - 36 seconds    Comment: Performed at [facility] [address] [place], [place] [address]  Urinalysis, Routine w reflex microscopic -Urine, Clean Catch     Status: Abnormal   Collection Time: 12/28/22 12:01 AM  Result Value Ref Range   Color, Urine STRAW (A) YELLOW   APPearance CLEAR CLEAR   Specific Gravity, Urine 1.021 1.005 - 1.030   pH 6.0 5.0 - 8.0   Glucose, UA NEGATIVE NEGATIVE mg/dL   Hgb urine dipstick SMALL (A) NEGATIVE   Bilirubin Urine NEGATIVE NEGATIVE   Ketones, ur 5 (A) NEGATIVE mg/dL   Protein, ur NEGATIVE NEGATIVE mg/dL   Nitrite NEGATIVE NEGATIVE   Leukocytes,Ua NEGATIVE NEGATIVE   RBC / HPF 0-5 0 - 5 RBC/hpf   WBC, UA 0-5 0 - 5 WBC/hpf   Bacteria, UA NONE SEEN NONE SEEN   Squamous Epithelial / HPF 6-10 0 - 5 /HPF    Comment: Performed at [facility], [place] [address], [place], [place] [address]   CT ANGIO HEAD NECK W WO CM  Result Date: 12/27/2022 CLINICAL DATA:  Acute neurologic deficit EXAM: CT ANGIOGRAPHY HEAD AND NECK TECHNIQUE: Multidetector CT imaging of the head and neck was performed using the standard protocol during bolus administration of intravenous contrast. Multiplanar CT image reconstructions and MIPs were obtained to evaluate the vascular anatomy. Carotid stenosis measurements (when applicable) are obtained utilizing NASCET criteria, using the distal internal carotid diameter as the denominator. RADIATION DOSE REDUCTION: This exam was performed according to the departmental dose-optimization program which includes automated exposure control, adjustment of the mA and/or kV according to patient size and/or use of iterative reconstruction technique. CONTRAST:  63m OMNIPAQUE IOHEXOL 350 MG/ML SOLN COMPARISON:  Brain MRI 12/27/2022 FINDINGS: CTA NECK FINDINGS SKELETON: There is no bony spinal canal  stenosis. No lytic or blastic lesion. OTHER NECK: Enlarged and heterogeneous thyroid gland. UPPER CHEST: Small pleural effusions. AORTIC ARCH: There is calcific atherosclerosis of the aortic arch. There is no aneurysm, dissection or hemodynamically significant stenosis of the visualized portion of the aorta. Conventional 3 vessel aortic branching pattern. The visualized proximal subclavian arteries are widely patent. RIGHT CAROTID SYSTEM: Normal without aneurysm, dissection or stenosis. LEFT CAROTID SYSTEM: Normal without aneurysm, dissection or stenosis. VERTEBRAL ARTERIES: Left dominant configuration. Both origins are clearly patent. There is no dissection, occlusion or flow-limiting stenosis to the skull base (V1-V3 segments). CTA HEAD FINDINGS POSTERIOR CIRCULATION: --Vertebral arteries: Normal V4 segments. --Inferior cerebellar arteries: Normal. --Basilar artery: Normal. --Superior cerebellar arteries: Normal. --Posterior cerebral arteries (PCA): Normal. ANTERIOR CIRCULATION: --Intracranial internal carotid arteries: Normal. --Anterior cerebral arteries (ACA): Normal. Both A1 segments are present. Patent anterior communicating artery (a-comm). --Middle cerebral arteries (MCA): Normal. VENOUS SINUSES: As permitted by contrast timing, patent. ANATOMIC  VARIANTS: None Review of the MIP images confirms the above findings. There is contrast enhancement at the areas of abnormal hyperintense T2-weighted signal on the earlier MRI, compatible with subacute infarcts. IMPRESSION: 1. No emergent large vessel occlusion or hemodynamically significant stenosis. 2. Leptomeningeal contrast enhancement in the right frontal operculum and right occipital lobe, compatible with subacute infarcts. 3. Small pleural effusions. 4. Incidental heterogeneous and enlarged thyroid. Recommend non-emergent thyroid ultrasound. Reference: J Am Coll Radiol. 2015 Feb;12(2): 143-50 Electronically Signed   By: Ulyses Jarred M.D.   On: 12/27/2022  21:46   MR BRAIN WO CONTRAST  Result Date: 12/27/2022 CLINICAL DATA:  Acute neurologic deficit EXAM: MRI HEAD WITHOUT CONTRAST TECHNIQUE: Multiplanar, multiecho pulse sequences of the brain and surrounding structures were obtained without intravenous contrast. COMPARISON:  None Available. FINDINGS: Brain: No acute infarct, mass effect or extra-axial collection. There are cortical/subcortical hyperintense T2-weighted signal lesions of the right frontal operculum and right occipital lobe. Possible small amount of blood products associated with the more lateral right occipital lesion. CSF spaces are normal. There is multifocal periventricular white matter hyperintensity, most often a result of chronic microvascular ischemia. The midline structures are normal. Vascular: Normal flow voids. Skull and upper cervical spine: Normal marrow signal. Sinuses/Orbits: Negative. Other: None. IMPRESSION: Cortical/subcortical lesions of the right frontal operculum and right occipital lobe. In the provided context, these are favored to represent subacute infarcts. Electronically Signed   By: Ulyses Jarred M.D.   On: 12/27/2022 21:12   CT Head Wo Contrast  Result Date: 12/27/2022 CLINICAL DATA:  Right frontal headache. EXAM: CT HEAD WITHOUT CONTRAST TECHNIQUE: Contiguous axial images were obtained from the base of the skull through the vertex without intravenous contrast. RADIATION DOSE REDUCTION: This exam was performed according to the departmental dose-optimization program which includes automated exposure control, adjustment of the mA and/or kV according to patient size and/or use of iterative reconstruction technique. COMPARISON:  None Available. FINDINGS: Brain: There is hypodensity with loss of gray-white differentiation in the right occipital lobe which could reflect subacute infarct. There is no evidence of hemorrhage or mass effect. There is no other evidence of acute infarct. There is no acute intracranial hemorrhage  or extra-axial fluid collection Background parenchymal volume is normal. The ventricles are normal in size. Gray-white differentiation is otherwise preserved. The pituitary and suprasellar region are normal. There is no mass lesion there is no mass effect or midline shift. Vascular: No hyperdense vessel or unexpected calcification. Skull: Normal. Negative for fracture or focal lesion. Sinuses/Orbits: There is moderate mucosal thickening in the right sphenoid sinus with surrounding hyperostosis consistent with chronic sinusitis. T Bilateral lens implants are in place. The globes and orbits are otherwise unremarkable. e Other: None. IMPRESSION: Possible subacute infarct in the right occipital lobe. Consider MRI for better evaluation. Electronically Signed   By: Valetta Mole M.D.   On: 12/27/2022 18:31    Pending Labs Unresulted Labs (From admission, onward)    None       Vitals/Pain Today's Vitals   12/28/22 1331 12/28/22 1333 12/28/22 1335 12/28/22 1345  BP:  134/76  (!) 144/82  Pulse:   62 61  Resp:  12 20 20  $ Temp: 97.9 F (36.6 C)     TempSrc: Oral     SpO2:   96% 95%  Weight:      Height:      PainSc:        Isolation Precautions No active isolations  Medications Medications  heparin injection 5,000 Units (  5,000 Units Subcutaneous Given 12/28/22 1327)  acetaminophen (TYLENOL) tablet 650 mg (has no administration in time range)    Or  acetaminophen (TYLENOL) suppository 650 mg (has no administration in time range)  ondansetron (ZOFRAN) tablet 4 mg (has no administration in time range)    Or  ondansetron (ZOFRAN) injection 4 mg (has no administration in time range)  hydrALAZINE (APRESOLINE) injection 10 mg (has no administration in time range)  aspirin EC tablet 81 mg (81 mg Oral Given 12/28/22 0911)  allopurinol (ZYLOPRIM) tablet 300 mg (300 mg Oral Given 12/28/22 0911)  carvedilol (COREG) tablet 25 mg (25 mg Oral Given 12/28/22 0911)  diltiazem (CARDIZEM CD) 24 hr capsule 120  mg (has no administration in time range)  losartan (COZAAR) tablet 100 mg (100 mg Oral Given 12/28/22 1327)  rosuvastatin (CRESTOR) tablet 40 mg (0 mg Oral Hold 12/28/22 0915)  clopidogrel (PLAVIX) tablet 75 mg (75 mg Oral Given 12/28/22 0911)  chlorthalidone (HYGROTON) tablet 25 mg (25 mg Oral Given 12/28/22 0912)  LORazepam (ATIVAN) injection 2 mg (2 mg Intravenous Given 12/27/22 2011)  iohexol (OMNIPAQUE) 350 MG/ML injection 75 mL (75 mLs Intravenous Contrast Given 12/27/22 2134)  labetalol (NORMODYNE) injection 10 mg (10 mg Intravenous Given 12/28/22 0048)    Mobility walks     Focused Assessments Neuro Assessment Handoff:  Swallow screen pass? Yes  Cardiac Rhythm: Normal sinus rhythm NIH Stroke Scale  Dizziness Present: No Headache Present: No Interval: Shift assessment Level of Consciousness (1a.)   : Alert, keenly responsive LOC Questions (1b. )   : Answers both questions correctly LOC Commands (1c. )   : Performs both tasks correctly Best Gaze (2. )  : Normal Visual (3. )  : No visual loss Facial Palsy (4. )    : Normal symmetrical movements Motor Arm, Left (5a. )   : No drift Motor Arm, Right (5b. ) : No drift Motor Leg, Left (6a. )  : No drift Motor Leg, Right (6b. ) : No drift Limb Ataxia (7. ): Absent Sensory (8. )  : Normal, no sensory loss Best Language (9. )  : No aphasia Dysarthria (10. ): Normal Extinction/Inattention (11.)   : No Abnormality Complete NIHSS TOTAL: 0     Neuro Assessment: Within Defined Limits Neuro Checks:   Shift assessment (12/28/22 1000)  Has TPA been given? No If patient is a Neuro Trauma and patient is going to OR before floor call report to [place] nurse: [phone] or [phone]   R Recommendations: See Admitting Provider Note  Report given to:   Additional Notes: pt diagnosed with tia 1 week ago. Seen for a right frontal headache yesterday. MRI showed a R occipital infarct. Patient's NIHSS has been 0. Denies c/o

## 2022-12-28 NOTE — H&P (Signed)
History and Physical    Julia Tucker [id number] DOB: 1946/02/23 DOA: 12/27/2022  DOS: the patient was seen and examined on 12/27/2022  PCP: Hadley Pen [facility]   Patient coming from: Home  I have personally briefly reviewed patient's old medical records in [place]  CC: headache HPI: 77 year old African-American female history of uncontrolled hypertension, CKD stage IIIb baseline creatinine approximately 1.5, type 2 diabetes not taking current medications, hyperlipidemia, peripheral vascular disease, presents to the ER today with headache.  She was admitted to [facility] on 12/16/2022 for stroke.  She had left facial droop at that time.  CT head was negative for stroke.  CT angiogram was negative for large vessel occlusion.  Bubble echocardiogram was negative for intra-atrial shunt.  EF of 65%.  There is no thrombus.  Patient refused MRI despite being offered sedation.  She was discharged home on aspirin, Plavix.  She was continued on her Crestor.  She was discharged home on her Coreg 25 mg, Cardizem 120 mg, chlorthalidone 25 mg and losartan 100 mg.  Patient states that she takes all of her blood pressure medications in the morning including Coreg, chlorthalidone, diltiazem, Cozaar.  She presented to the ER today with complaints of headache.  CT head showed subacute infarct in the right occipital lobe.  This was confirmed on MRI that showed subacute infarct in the right occipital lobe.  CT angio was negative for large vessel occlusion.  EDP is consulted neurology.  [facility] hospitalist contacted for admission.    ED Course: CT head showed subacute right occipital CVA. MRI showed right occipital subacute CVA  Review of Systems:  Review of Systems  Constitutional: Negative.   HENT: Negative.    Eyes: Negative.   Respiratory: Negative.    Cardiovascular: Negative.   Gastrointestinal: Negative.   Genitourinary: Negative.   Musculoskeletal: Negative.    Skin: Negative.   Neurological:  Positive for headaches.  Endo/Heme/Allergies: Negative.   Psychiatric/Behavioral: Negative.    All other systems reviewed and are negative.   Past Medical History:  Diagnosis Date   Arthritis    Hepatitis    Hx: of Hep C   Hypertension    Kidney stones    Hx: of   Pneumonia    Shortness of breath    Hx: of with exertion   Tachycardia    Hx: of PSVT - Dr. Clarene Critchley and Dr. Adrian Prows [facility])    Past Surgical History:  Procedure Laterality Date   ABDOMINAL HYSTERECTOMY     APPENDECTOMY     BREAST SURGERY     COLONOSCOPY W/ BIOPSIES AND POLYPECTOMY     Hx; of   CYST EXCISION     Hx: of on chin   FRACTURE SURGERY     plate and 12 screws in Right ankle   JOINT REPLACEMENT     KNEE ARTHROPLASTY Left 07/17/2013   Procedure: COMPUTER ASSISTED TOTAL KNEE ARTHROPLASTY;  Surgeon: Marybelle Killings, MD;  Location: [place];  Service: Orthopedics;  Laterality: Left;  Left Total Knee Arthroplasty, Cemented, Computer Assist   TONSILLECTOMY       reports that she has quit smoking. She has never used smokeless tobacco. She reports that she does not drink alcohol and does not use drugs.  Allergies  Allergen Reactions   Clonidine Other (See Comments)    drowsiness   Triamcinolone Acetonide Rash   Prinivil [Lisinopril]     Swelling     Family History  Problem Relation Age  of Onset   Hypertension Mother    Cancer - Lung Father    Cancer - Other Sister     Prior to Admission medications   Medication Sig Start Date End Date Taking? Authorizing Provider  allopurinol (ZYLOPRIM) 300 MG tablet Take 300 mg by mouth daily.   Yes [provider]  aspirin EC 81 MG tablet Take 81 mg by mouth daily. Swallow whole.   Yes [provider]  carvedilol (COREG) 25 MG tablet Take 25 mg by mouth 2 (two) times daily. 01/23/15  Yes [provider]  diltiazem (DILACOR XR) 120 MG 24 hr capsule Take 120 mg by mouth daily.   Yes  [provider]  losartan (COZAAR) 100 MG tablet Take 100 mg by mouth daily.   Yes [provider]  rosuvastatin (CRESTOR) 40 MG tablet Take 40 mg by mouth daily.   Yes [provider]  furosemide (LASIX) 40 MG tablet Take 40 mg by mouth every morning.    [provider]  ibuprofen (ADVIL,MOTRIN) 600 MG tablet Take 1 tablet (600 mg total) by mouth every 6 (six) hours as needed for pain. 08/12/12   Julianne Rice, MD  methocarbamol (ROBAXIN) 500 MG tablet Take 1 tablet (500 mg total) by mouth every 6 (six) hours as needed (spasm). 07/17/13   Phillips Hay, PA-C  naproxen sodium (ANAPROX) 220 MG tablet Take 660 mg by mouth 2 (two) times daily as needed.    [provider]  oxyCODONE-acetaminophen (PERCOCET) 10-325 MG per tablet Take 1 tablet by mouth every 4 (four) hours as needed for pain.    [provider]  oxyCODONE-acetaminophen (ROXICET) 5-325 MG per tablet Take 1-2 tablets by mouth every 4 (four) hours as needed for pain. 07/17/13   Phillips Hay, PA-C  oxyCODONE-acetaminophen (ROXICET) 5-325 MG per tablet Take 1 tablet by mouth every 4 (four) hours as needed for pain. 07/20/13   Newt Minion, MD    Physical Exam: Vitals:   12/27/22 1432 12/27/22 1902 12/27/22 2231 12/28/22 0025  BP:  (!) 186/91 (!) 195/75 (!) 190/79  Pulse:  (!) 53 (!) 53 (!) 54  Resp:  18 17 18  $ Temp:   98.1 F (36.7 C)   TempSrc:   Tympanic   SpO2:  100% 98% 96%  Weight: 121.1 kg     Height: 5' 7"$  (1.702 m)       Physical Exam Vitals and nursing note reviewed.  Constitutional:      General: She is not in acute distress.    Appearance: She is not ill-appearing, toxic-appearing or diaphoretic.  HENT:     Head: Normocephalic and atraumatic.     Nose: Nose normal.  Eyes:     General: No scleral icterus. Cardiovascular:     Rate and Rhythm: Normal rate and regular rhythm.  Pulmonary:     Effort: Pulmonary effort is normal.     Breath sounds: Normal  breath sounds.  Abdominal:     General: Abdomen is flat. Bowel sounds are normal.     Palpations: Abdomen is soft.     Tenderness: There is no abdominal tenderness. There is no guarding.  Musculoskeletal:     Right lower leg: No edema.     Left lower leg: No edema.  Skin:    General: Skin is warm and dry.     Capillary Refill: Capillary refill takes less than 2 seconds.  Neurological:     General: No focal deficit present.  Mental Status: She is alert and oriented to person, place, and time.      Labs on Admission: I have personally reviewed following labs and imaging studies  CBC: Recent Labs  Lab 12/27/22 1551  WBC 4.5  NEUTROABS 2.4  HGB 13.2  HCT 40.3  MCV 83.3  PLT [id number]   Basic Metabolic Panel: Recent Labs  Lab 12/27/22 1551  NA 139  K 3.7  CL 106  CO2 24  GLUCOSE 93  BUN 14  CREATININE 1.11*  CALCIUM 9.1   GFR: Estimated Creatinine Clearance: 59 mL/min (A) (by C-G formula based on SCr of 1.11 mg/dL (H)). Liver Function Tests: Recent Labs  Lab 12/27/22 1551  AST 23  ALT 15  ALKPHOS 96  BILITOT 0.5  PROT 7.1  ALBUMIN 3.4*   No results for input(s): "LIPASE", "AMYLASE" in the last 168 hours. No results for input(s): "AMMONIA" in the last 168 hours. Coagulation Profile: Recent Labs  Lab 12/27/22 2249  INR 1.3*   Cardiac Enzymes: No results for input(s): "CKTOTAL", "CKMB", "CKMBINDEX", "TROPONINI", "TROPONINIHS" in the last 168 hours. BNP (last 3 results) No results for input(s): "PROBNP" in the last 8760 hours. HbA1C: No results for input(s): "HGBA1C" in the last 72 hours. CBG: No results for input(s): "GLUCAP" in the last 168 hours. Lipid Profile: No results for input(s): "CHOL", "HDL", "LDLCALC", "TRIG", "CHOLHDL", "LDLDIRECT" in the last 72 hours. Thyroid Function Tests: No results for input(s): "TSH", "T4TOTAL", "FREET4", "T3FREE", "THYROIDAB" in the last 72 hours. Anemia Panel: No results for input(s): "VITAMINB12", "FOLATE",  "FERRITIN", "TIBC", "IRON", "RETICCTPCT" in the last 72 hours. Urine analysis:    Component Value Date/Time   COLORURINE STRAW (A) 12/28/2022 0001   APPEARANCEUR CLEAR 12/28/2022 0001   LABSPEC 1.021 12/28/2022 0001   PHURINE 6.0 12/28/2022 0001   GLUCOSEU NEGATIVE 12/28/2022 0001   HGBUR SMALL (A) 12/28/2022 0001   BILIRUBINUR NEGATIVE 12/28/2022 0001   KETONESUR 5 (A) 12/28/2022 0001   PROTEINUR NEGATIVE 12/28/2022 0001   UROBILINOGEN 1.0 07/11/2013 1010   NITRITE NEGATIVE 12/28/2022 0001   LEUKOCYTESUR NEGATIVE 12/28/2022 0001    Radiological Exams on Admission: I have personally reviewed images CT ANGIO HEAD NECK W WO CM  Result Date: 12/27/2022 CLINICAL DATA:  Acute neurologic deficit EXAM: CT ANGIOGRAPHY HEAD AND NECK TECHNIQUE: Multidetector CT imaging of the head and neck was performed using the standard protocol during bolus administration of intravenous contrast. Multiplanar CT image reconstructions and MIPs were obtained to evaluate the vascular anatomy. Carotid stenosis measurements (when applicable) are obtained utilizing NASCET criteria, using the distal internal carotid diameter as the denominator. RADIATION DOSE REDUCTION: This exam was performed according to the departmental dose-optimization program which includes automated exposure control, adjustment of the mA and/or kV according to patient size and/or use of iterative reconstruction technique. CONTRAST:  16m OMNIPAQUE IOHEXOL 350 MG/ML SOLN COMPARISON:  Brain MRI 12/27/2022 FINDINGS: CTA NECK FINDINGS SKELETON: There is no bony spinal canal stenosis. No lytic or blastic lesion. OTHER NECK: Enlarged and heterogeneous thyroid gland. UPPER CHEST: Small pleural effusions. AORTIC ARCH: There is calcific atherosclerosis of the aortic arch. There is no aneurysm, dissection or hemodynamically significant stenosis of the visualized portion of the aorta. Conventional 3 vessel aortic branching pattern. The visualized proximal  subclavian arteries are widely patent. RIGHT CAROTID SYSTEM: Normal without aneurysm, dissection or stenosis. LEFT CAROTID SYSTEM: Normal without aneurysm, dissection or stenosis. VERTEBRAL ARTERIES: Left dominant configuration. Both origins are clearly patent. There is no dissection, occlusion or flow-limiting stenosis  to the skull base (V1-V3 segments). CTA HEAD FINDINGS POSTERIOR CIRCULATION: --Vertebral arteries: Normal V4 segments. --Inferior cerebellar arteries: Normal. --Basilar artery: Normal. --Superior cerebellar arteries: Normal. --Posterior cerebral arteries (PCA): Normal. ANTERIOR CIRCULATION: --Intracranial internal carotid arteries: Normal. --Anterior cerebral arteries (ACA): Normal. Both A1 segments are present. Patent anterior communicating artery (a-comm). --Middle cerebral arteries (MCA): Normal. VENOUS SINUSES: As permitted by contrast timing, patent. ANATOMIC VARIANTS: None Review of the MIP images confirms the above findings. There is contrast enhancement at the areas of abnormal hyperintense T2-weighted signal on the earlier MRI, compatible with subacute infarcts. IMPRESSION: 1. No emergent large vessel occlusion or hemodynamically significant stenosis. 2. Leptomeningeal contrast enhancement in the right frontal operculum and right occipital lobe, compatible with subacute infarcts. 3. Small pleural effusions. 4. Incidental heterogeneous and enlarged thyroid. Recommend non-emergent thyroid ultrasound. Reference: J Am Coll Radiol. 2015 Feb;12(2): 143-50 Electronically Signed   By: Ulyses Jarred M.D.   On: 12/27/2022 21:46   MR BRAIN WO CONTRAST  Result Date: 12/27/2022 CLINICAL DATA:  Acute neurologic deficit EXAM: MRI HEAD WITHOUT CONTRAST TECHNIQUE: Multiplanar, multiecho pulse sequences of the brain and surrounding structures were obtained without intravenous contrast. COMPARISON:  None Available. FINDINGS: Brain: No acute infarct, mass effect or extra-axial collection. There are  cortical/subcortical hyperintense T2-weighted signal lesions of the right frontal operculum and right occipital lobe. Possible small amount of blood products associated with the more lateral right occipital lesion. CSF spaces are normal. There is multifocal periventricular white matter hyperintensity, most often a result of chronic microvascular ischemia. The midline structures are normal. Vascular: Normal flow voids. Skull and upper cervical spine: Normal marrow signal. Sinuses/Orbits: Negative. Other: None. IMPRESSION: Cortical/subcortical lesions of the right frontal operculum and right occipital lobe. In the provided context, these are favored to represent subacute infarcts. Electronically Signed   By: Ulyses Jarred M.D.   On: 12/27/2022 21:12   CT Head Wo Contrast  Result Date: 12/27/2022 CLINICAL DATA:  Right frontal headache. EXAM: CT HEAD WITHOUT CONTRAST TECHNIQUE: Contiguous axial images were obtained from the base of the skull through the vertex without intravenous contrast. RADIATION DOSE REDUCTION: This exam was performed according to the departmental dose-optimization program which includes automated exposure control, adjustment of the mA and/or kV according to patient size and/or use of iterative reconstruction technique. COMPARISON:  None Available. FINDINGS: Brain: There is hypodensity with loss of gray-white differentiation in the right occipital lobe which could reflect subacute infarct. There is no evidence of hemorrhage or mass effect. There is no other evidence of acute infarct. There is no acute intracranial hemorrhage or extra-axial fluid collection Background parenchymal volume is normal. The ventricles are normal in size. Gray-white differentiation is otherwise preserved. The pituitary and suprasellar region are normal. There is no mass lesion there is no mass effect or midline shift. Vascular: No hyperdense vessel or unexpected calcification. Skull: Normal. Negative for fracture or  focal lesion. Sinuses/Orbits: There is moderate mucosal thickening in the right sphenoid sinus with surrounding hyperostosis consistent with chronic sinusitis. T Bilateral lens implants are in place. The globes and orbits are otherwise unremarkable. e Other: None. IMPRESSION: Possible subacute infarct in the right occipital lobe. Consider MRI for better evaluation. Electronically Signed   By: Valetta Mole M.D.   On: 12/27/2022 18:31    EKG: My personal interpretation of EKG shows: sinus bradycardia    Assessment/Plan Principal Problem:   Uncontrolled hypertension Active Problems:   Stroke (cerebrum) (HCC)   Chronic kidney disease, stage 3b (HCC) - baseline  SCr 1.45    Assessment and Plan: * Uncontrolled hypertension Observation med/tele bed. Pt need better BP control. I have a feeling this is due to timing of meds rather than total doses. Pt takes all of her HTN meds in the AM. This includes 25 mg coreg, 120 mg Cardizem CD and 25 mg chlorthalidone. Will need to space on her meds so that she takes meds in the AM and meds in the PM. Pt states her coreg was decreased to 25 mg daily instead of 25 mg bid.   Chronic kidney disease, stage 3b (HCC) - baseline SCr 1.45 Chronic. Stable.  Stroke (cerebrum) [facility]) This happened on 12-16-2022 when she was admitted to [facility]. Pt had left facial droop. Pt refused MRI at that time. Today's MRI shows subacute CVA. Pt had CTA that showed: CTA HEAD NECK  1.  No large vessel occlusion identified.  2.  No aneurysm or dissection identified.  3.  Enlarged complex left thyroid lobe nodule measuring up to 4 cm.   Echo at [facility] was negative for thrombosis. Bubble study negative for shunt  Left Ventricle Left ventricle size is normal. There is severe concentric  hypertrophy. EF: 60-65%. Quantitative analysis of left ventricular Global  Longitudinal Strain (GLS) imaging is -17.300%. No regional wall motion  abnormalities noted. Doppler  parameters consistent with restrictive  filling pattern and markedly elevated LA pressure.  Right Ventricle Right ventricle size is normal. Systolic function is  normal.  Left Atrium Left atrium is moderately dilated at 4.500 cmLeft atrium  volume index is moderately increased (42-48 mL/m2). Atrial septum appears  intact. Injection of agitated saline documents no interatrial shunt.  Right Atrium Right atrium is mildly dilated.  IVC/SVC The inferior vena cava demonstrates a diameter of <=2.1 cm and  collapses >50%; therefore, the right atrial pressure is estimated at 3  mmHg.  Mitral Valve The leaflets are mildly thickened. There is mild  regurgitation.  Tricuspid Valve Tricuspid valve structure is normal. There is trace  regurgitation. The right ventricular systolic pressure is normal (<36  mmHg).  Aortic Valve The aortic valve is tricuspid. The leaflets exhibit normal  excursion. The leaflets are calcified. There is sclerosis. Trace aortic  valve regurgitation. There is no evidence of aortic valve stenosis.  Pulmonic Valve The pulmonic valve was not well visualized. Trace  regurgitation. There is no evidence of pulmonic valve stenosis.  Ascending Aorta The aortic root is normal in size. The ascending aorta is  normal in size.  Pericardium There is a trivial pericardial effusion noted.  Left Ventricle  Left ventricle size is normal. There is severe concentric hypertrophy. EF: 60-65%. Quantitative analysis of left ventricular Global Longitudinal Strain (GLS) imaging is -17.300%. No regional wall motion abnormalities noted. Doppler parameters consistent with restrictive filling pattern and markedly elevated LA pressure.   Right Ventricle  Right ventricle size is normal. Systolic function is normal.   Left Atrium  Left atrium is moderately dilated at 4.500 cmLeft atrium volume index is moderately increased (42-48 mL/m2). Atrial septum appears intact. Injection of agitated saline documents  no interatrial shunt.   Right Atrium  Right atrium is mildly dilated.   IVC/SVC  The inferior vena cava demonstrates a diameter of <=2.1 cm and collapses >50%; therefore, the right atrial pressure is estimated at 3 mmHg.   Mitral Valve  The leaflets are mildly thickened. There is mild regurgitation.   Tricuspid Valve  Tricuspid valve structure is normal. There is trace regurgitation. The right  ventricular systolic pressure is normal (<36 mmHg).   Aortic Valve  The aortic valve is tricuspid. The leaflets exhibit normal excursion. The leaflets are calcified. There is sclerosis. Trace aortic valve regurgitation. There is no evidence of aortic valve stenosis.   Pulmonic Valve  The pulmonic valve was not well visualized. Trace regurgitation. There is no evidence of pulmonic valve stenosis.   Ascending Aorta  The aortic root is normal in size. The ascending aorta is normal in size.   Pericardium  There is a trivial pericardial effusion noted.   Lipid panel showed:  Ref Range & Units 11 d ago   CHOLESTEROL TOTAL 100 - 199 mg/dL 128  Trig 0 - 149 mg/dL 100  HDL >=39 mg/dL 48  LDL 0 - 99 mg/dL 60  VLDL 5 - 40 mg/dl 20  CHOL/HDL 0 - 5 3    Pt does not have any issues with walking or swallowing. Given that her CVA on MRI brain is subacute, she need no further workup. Pt already on ASA 81 mg and plavix 75 mg. Pt already on crestor 40 mg   DVT prophylaxis: SQ Heparin Code Status: Full Code Family Communication: discussed with pt, pt's grand-dtr and pt's friend doris at bedside  Disposition Plan: return home  Consults called: EDP has consulted neurology  Admission status: Observation, Telemetry bed   Kristopher Oppenheim, DO [facility] 12/28/2022, 12:42 AM

## 2022-12-28 NOTE — Assessment & Plan Note (Signed)
Observation med/tele bed. Pt need better BP control. I have a feeling this is due to timing of meds rather than total doses. Pt takes all of her HTN meds in the AM. This includes 25 mg coreg, 120 mg Cardizem CD and 25 mg chlorthalidone. Will need to space on her meds so that she takes meds in the AM and meds in the PM. Pt states her coreg was decreased to 25 mg daily instead of 25 mg bid.

## 2022-12-28 NOTE — Plan of Care (Signed)

## 2022-12-28 NOTE — Assessment & Plan Note (Signed)
Chronic Stable 

## 2022-12-29 ENCOUNTER — Other Ambulatory Visit (HOSPITAL_COMMUNITY): Payer: Self-pay

## 2022-12-29 DIAGNOSIS — I639 Cerebral infarction, unspecified: Secondary | ICD-10-CM | POA: Diagnosis not present

## 2022-12-29 DIAGNOSIS — I10 Essential (primary) hypertension: Secondary | ICD-10-CM | POA: Diagnosis not present

## 2022-12-29 MED ORDER — AMLODIPINE BESYLATE 2.5 MG PO TABS
2.5000 mg | ORAL_TABLET | Freq: Every day | ORAL | Status: DC
  Administered 2022-12-29: 2.5 mg via ORAL
  Filled 2022-12-29: qty 1

## 2022-12-29 MED ORDER — AMLODIPINE BESYLATE 2.5 MG PO TABS
2.5000 mg | ORAL_TABLET | Freq: Every day | ORAL | 2 refills | Status: DC
  Filled 2022-12-29: qty 30, 30d supply, fill #0

## 2022-12-29 NOTE — Discharge Summary (Signed)
Physician Discharge Summary  Julia TUTOR [id number] DOB: [birth year] DOA: 12/27/2022  PCP: [place] date: 12/27/2022 Discharge date: 12/29/2022  Admitted From: Home Disposition:  Home  Recommendations for Outpatient Follow-up:  Follow up with PCP in 1-2 weeks Please obtain BMP/CBC in one week   Home Health:No Equipment/Devices:None  Discharge Condition:Stable CODE STATUS:Full Diet recommendation: Heart Healthy   Brief/Interim Summary:  77 y.o. female past medical history of uncontrolled hypertension, chronic kidney disease stage IIIb, with a creatinine of approximately 1.5, diabetes mellitus type 2 noncompliant with her medication, peripheral vascular disease comes to the ED with a headache recently discharged from [facility] on 12/16/2022 for stroke workup was negative except for MRI brain was not performed as the patient refused she was discharged home on aspirin and Plavix she was continued on Crestor, Coreg, Cardizem, hydrochlorothiazide, losartan comes into the ED today with headache, CT of the head here at [facility] showed a subacute right occipital infarct, MRI of the brain showed subacute confirmation of the right occipital infarct, CTA was negative for LVO.  Neurology was consulted   Discharge Diagnoses:  Principal Problem:   Uncontrolled hypertension Active Problems:   Stroke (cerebrum) (HCC)   Chronic kidney disease, stage 3b (HCC) - baseline SCr 1.45  Uncontrolled hypertension: Likely due to noncompliance she was restarted on Coreg, Cardizem and chlorthalidone. Her blood pressure remained elevated despite ECV start low-dose Norvasc we will continue as an outpatient.  History of CVA: This happened back in 39 2024. MRI of the brain confirmed subacute CVA. Neurology was consulted recommend to continue aspirin and Plavix continue statins. She was monitored on telemetry with no events. Physical therapy evaluated the patient  recommended home health PT.  Chronic disease stage IIIa: With a baseline creatinine 1.4 creatinine remained at baseline.  Discharge Instructions  Discharge Instructions     Diet - low sodium heart healthy   Complete by: As directed    Increase activity slowly   Complete by: As directed       Allergies as of 12/29/2022       Reactions   Clonidine Other (See Comments)   drowsiness   Triamcinolone Acetonide Rash   Prinivil [lisinopril]    Swelling         Medication List     TAKE these medications    allopurinol 300 MG tablet Commonly known as: ZYLOPRIM Take 300 mg by mouth daily.   amLODipine 2.5 MG tablet Commonly known as: NORVASC Take 1 tablet (2.5 mg total) by mouth daily.   aspirin EC 81 MG tablet Take 81 mg by mouth daily. Swallow whole.   carvedilol 25 MG tablet Commonly known as: COREG Take 25 mg by mouth 2 (two) times daily.   chlorthalidone 25 MG tablet Commonly known as: HYGROTON Take 25 mg by mouth daily.   clopidogrel 75 MG tablet Commonly known as: PLAVIX Take 75 mg by mouth daily.   diltiazem 120 MG 24 hr capsule Commonly known as: DILACOR XR Take 120 mg by mouth daily.   losartan 100 MG tablet Commonly known as: COZAAR Take 100 mg by mouth daily.   potassium chloride 10 MEQ tablet Commonly known as: KLOR-CON Take 10 mEq by mouth daily.   rosuvastatin 40 MG tablet Commonly known as: CRESTOR Take 40 mg by mouth daily.        Allergies  Allergen Reactions   Clonidine Other (See Comments)    drowsiness   Triamcinolone Acetonide Rash   Prinivil [  Lisinopril]     Swelling     Consultations: Neurology p   Procedures/Studies: CT ANGIO HEAD NECK W WO CM  Result Date: 12/27/2022 CLINICAL DATA:  Acute neurologic deficit EXAM: CT ANGIOGRAPHY HEAD AND NECK TECHNIQUE: Multidetector CT imaging of the head and neck was performed using the standard protocol during bolus administration of intravenous contrast. Multiplanar CT image  reconstructions and MIPs were obtained to evaluate the vascular anatomy. Carotid stenosis measurements (when applicable) are obtained utilizing NASCET criteria, using the distal internal carotid diameter as the denominator. RADIATION DOSE REDUCTION: This exam was performed according to the departmental dose-optimization program which includes automated exposure control, adjustment of the mA and/or kV according to patient size and/or use of iterative reconstruction technique. CONTRAST:  70m OMNIPAQUE IOHEXOL 350 MG/ML SOLN COMPARISON:  Brain MRI 12/27/2022 FINDINGS: CTA NECK FINDINGS SKELETON: There is no bony spinal canal stenosis. No lytic or blastic lesion. OTHER NECK: Enlarged and heterogeneous thyroid gland. UPPER CHEST: Small pleural effusions. AORTIC ARCH: There is calcific atherosclerosis of the aortic arch. There is no aneurysm, dissection or hemodynamically significant stenosis of the visualized portion of the aorta. Conventional 3 vessel aortic branching pattern. The visualized proximal subclavian arteries are widely patent. RIGHT CAROTID SYSTEM: Normal without aneurysm, dissection or stenosis. LEFT CAROTID SYSTEM: Normal without aneurysm, dissection or stenosis. VERTEBRAL ARTERIES: Left dominant configuration. Both origins are clearly patent. There is no dissection, occlusion or flow-limiting stenosis to the skull base (V1-V3 segments). CTA HEAD FINDINGS POSTERIOR CIRCULATION: --Vertebral arteries: Normal V4 segments. --Inferior cerebellar arteries: Normal. --Basilar artery: Normal. --Superior cerebellar arteries: Normal. --Posterior cerebral arteries (PCA): Normal. ANTERIOR CIRCULATION: --Intracranial internal carotid arteries: Normal. --Anterior cerebral arteries (ACA): Normal. Both A1 segments are present. Patent anterior communicating artery (a-comm). --Middle cerebral arteries (MCA): Normal. VENOUS SINUSES: As permitted by contrast timing, patent. ANATOMIC VARIANTS: None Review of the MIP images  confirms the above findings. There is contrast enhancement at the areas of abnormal hyperintense T2-weighted signal on the earlier MRI, compatible with subacute infarcts. IMPRESSION: 1. No emergent large vessel occlusion or hemodynamically significant stenosis. 2. Leptomeningeal contrast enhancement in the right frontal operculum and right occipital lobe, compatible with subacute infarcts. 3. Small pleural effusions. 4. Incidental heterogeneous and enlarged thyroid. Recommend non-emergent thyroid ultrasound. Reference: J Am Coll Radiol. 2015 Feb;12(2): 143-50 Electronically Signed   By: KUlyses JarredM.D.   On: 12/27/2022 21:46   MR BRAIN WO CONTRAST  Result Date: 12/27/2022 CLINICAL DATA:  Acute neurologic deficit EXAM: MRI HEAD WITHOUT CONTRAST TECHNIQUE: Multiplanar, multiecho pulse sequences of the brain and surrounding structures were obtained without intravenous contrast. COMPARISON:  None Available. FINDINGS: Brain: No acute infarct, mass effect or extra-axial collection. There are cortical/subcortical hyperintense T2-weighted signal lesions of the right frontal operculum and right occipital lobe. Possible small amount of blood products associated with the more lateral right occipital lesion. CSF spaces are normal. There is multifocal periventricular white matter hyperintensity, most often a result of chronic microvascular ischemia. The midline structures are normal. Vascular: Normal flow voids. Skull and upper cervical spine: Normal marrow signal. Sinuses/Orbits: Negative. Other: None. IMPRESSION: Cortical/subcortical lesions of the right frontal operculum and right occipital lobe. In the provided context, these are favored to represent subacute infarcts. Electronically Signed   By: KUlyses JarredM.D.   On: 12/27/2022 21:12   CT Head Wo Contrast  Result Date: 12/27/2022 CLINICAL DATA:  Right frontal headache. EXAM: CT HEAD WITHOUT CONTRAST TECHNIQUE: Contiguous axial images were obtained from the  base of the  skull through the vertex without intravenous contrast. RADIATION DOSE REDUCTION: This exam was performed according to the departmental dose-optimization program which includes automated exposure control, adjustment of the mA and/or kV according to patient size and/or use of iterative reconstruction technique. COMPARISON:  None Available. FINDINGS: Brain: There is hypodensity with loss of gray-white differentiation in the right occipital lobe which could reflect subacute infarct. There is no evidence of hemorrhage or mass effect. There is no other evidence of acute infarct. There is no acute intracranial hemorrhage or extra-axial fluid collection Background parenchymal volume is normal. The ventricles are normal in size. Gray-white differentiation is otherwise preserved. The pituitary and suprasellar region are normal. There is no mass lesion there is no mass effect or midline shift. Vascular: No hyperdense vessel or unexpected calcification. Skull: Normal. Negative for fracture or focal lesion. Sinuses/Orbits: There is moderate mucosal thickening in the right sphenoid sinus with surrounding hyperostosis consistent with chronic sinusitis. T Bilateral lens implants are in place. The globes and orbits are otherwise unremarkable. e Other: None. IMPRESSION: Possible subacute infarct in the right occipital lobe. Consider MRI for better evaluation. Electronically Signed   By: Valetta Mole M.D.   On: 12/27/2022 18:31   (Echo, Carotid, EGD, Colonoscopy, ERCP)    Subjective: No complaints  Discharge Exam: Vitals:   12/29/22 0720 12/29/22 1114  BP: (!) 149/83 136/83  Pulse: (!) 49 (!) 49  Resp: 18 18  Temp: 98.1 F (36.7 C) 98.1 F (36.7 C)  SpO2: 92% 100%   Vitals:   12/28/22 2327 12/29/22 0333 12/29/22 0720 12/29/22 1114  BP: (!) 147/88 (!) 152/93 (!) 149/83 136/83  Pulse: (!) 54 (!) 47 (!) 49 (!) 49  Resp: 16 16 18 18  $ Temp: 97.9 F (36.6 C) 98 F (36.7 C) 98.1 F (36.7 C) 98.1 F (36.7  C)  TempSrc: Oral Oral Oral Oral  SpO2: 96% 99% 92% 100%  Weight:      Height:        General: Pt is alert, awake, not in acute distress Cardiovascular: RRR, S1/S2 +, no rubs, no gallops Respiratory: CTA bilaterally, no wheezing, no rhonchi Abdominal: Soft, NT, ND, bowel sounds + Extremities: no edema, no cyanosis    The results of significant diagnostics from this hospitalization (including imaging, microbiology, ancillary and laboratory) are listed below for reference.     Microbiology: No results found for this or any previous visit (from the past 240 hour(s)).   Labs: BNP (last 3 results) No results for input(s): "BNP" in the last 8760 hours. Basic Metabolic Panel: Recent Labs  Lab 12/27/22 1551  NA 139  K 3.7  CL 106  CO2 24  GLUCOSE 93  BUN 14  CREATININE 1.11*  CALCIUM 9.1   Liver Function Tests: Recent Labs  Lab 12/27/22 1551  AST 23  ALT 15  ALKPHOS 96  BILITOT 0.5  PROT 7.1  ALBUMIN 3.4*   No results for input(s): "LIPASE", "AMYLASE" in the last 168 hours. No results for input(s): "AMMONIA" in the last 168 hours. CBC: Recent Labs  Lab 12/27/22 1551  WBC 4.5  NEUTROABS 2.4  HGB 13.2  HCT 40.3  MCV 83.3  PLT 236   Cardiac Enzymes: No results for input(s): "CKTOTAL", "CKMB", "CKMBINDEX", "TROPONINI" in the last 168 hours. BNP: Invalid input(s): "POCBNP" CBG: No results for input(s): "GLUCAP" in the last 168 hours. D-Dimer No results for input(s): "DDIMER" in the last 72 hours. Hgb A1c No results for input(s): "HGBA1C" in the last 72  hours. Lipid Profile Recent Labs    12/27/22 1551  CHOL 96  HDL 41  LDLCALC 41  TRIG 71  CHOLHDL 2.3   Thyroid function studies No results for input(s): "TSH", "T4TOTAL", "T3FREE", "THYROIDAB" in the last 72 hours.  Invalid input(s): "FREET3" Anemia work up No results for input(s): "VITAMINB12", "FOLATE", "FERRITIN", "TIBC", "IRON", "RETICCTPCT" in the last 72 hours. Urinalysis    Component  Value Date/Time   COLORURINE STRAW (A) 12/28/2022 0001   APPEARANCEUR CLEAR 12/28/2022 0001   LABSPEC 1.021 12/28/2022 0001   PHURINE 6.0 12/28/2022 0001   GLUCOSEU NEGATIVE 12/28/2022 0001   HGBUR SMALL (A) 12/28/2022 0001   BILIRUBINUR NEGATIVE 12/28/2022 0001   KETONESUR 5 (A) 12/28/2022 0001   PROTEINUR NEGATIVE 12/28/2022 0001   UROBILINOGEN 1.0 07/11/2013 1010   NITRITE NEGATIVE 12/28/2022 0001   LEUKOCYTESUR NEGATIVE 12/28/2022 0001   Sepsis Labs Recent Labs  Lab 12/27/22 1551  WBC 4.5   Microbiology No results found for this or any previous visit (from the past 240 hour(s)).  SIGNED:   Charlynne Cousins, MD  [facility] 12/29/2022, 12:56 PM Pager   If 7PM-7AM, please contact night-coverage [email or web address] Password [email or web address]

## 2022-12-29 NOTE — Progress Notes (Addendum)
[facility] PROGRESS NOTE    Progress Note  Julia Tucker  [id number] DOB: 1945-12-01 DOA: 12/27/2022 PCP: [place]     Brief Narrative:   Julia Tucker is an 77 y.o. female past medical history of uncontrolled hypertension, chronic kidney disease stage IIIb, with a creatinine of approximately 1.5, diabetes mellitus type 2 noncompliant with her medication, peripheral vascular disease comes to the ED with a headache recently discharged from [facility] on 12/16/2022 for stroke workup was negative except for MRI brain was not performed as the patient refused she was discharged home on aspirin and Plavix she was continued on Crestor, Coreg, Cardizem, hydrochlorothiazide, losartan comes into the ED today with headache, CT of the head here at [facility] showed a subacute right occipital infarct, MRI of the brain showed subacute confirmation of the right occipital infarct, CTA was negative for LVO.  Neurology was consulted    Assessment/Plan:   Uncontrolled hypertension She relates she took all of her meds this morning.  Coreg 25, Cardizem 120, chlorthalidone 25. Blood pressure continues to to be elevated despite her home regimen. Will start her on low-dose Norvasc.  History of CVA: This happened back in 12/16/2022. MRI of the brain today confirms subacute CVA. Will continue aspirin and Plavix. Fasting lipid panel HDL >40, LDL 41. PT OT eval is pending. Start patient on ASA 51m daily and plavix 739mand crestor. Telemetry monitoring. Continue aspirin and Plavix and outpatient follow-up with neurology as an outpatient. Chronic kidney disease, stage 3b (HCC) - baseline SCr 1.45 Creatinine seems at baseline.   DVT prophylaxis: Lovenox Family Communication:none Status is: Observation The patient remains OBS appropriate and will d/c before 2 midnights.    Code Status:     Code Status Orders  (From admission, onward)           Start      Ordered   12/28/22 0046  Full code  Continuous       Question:  By:  Answer:  Consent: discussion documented in EHR   12/28/22 0046           Code Status History     Date Active Date Inactive Code Status Order ID Comments User Context   12/28/2022 0026 12/28/2022 0046 Full Code [id number] [email or web address] ED         IV Access:   Peripheral IV   Procedures and diagnostic studies:   CT ANGIO HEAD NECK W WO CM  Result Date: 12/27/2022 CLINICAL DATA:  Acute neurologic deficit EXAM: CT ANGIOGRAPHY HEAD AND NECK TECHNIQUE: Multidetector CT imaging of the head and neck was performed using the standard protocol during bolus administration of intravenous contrast. Multiplanar CT image reconstructions and MIPs were obtained to evaluate the vascular anatomy. Carotid stenosis measurements (when applicable) are obtained utilizing NASCET criteria, using the distal internal carotid diameter as the denominator. RADIATION DOSE REDUCTION: This exam was performed according to the departmental dose-optimization program which includes automated exposure control, adjustment of the mA and/or kV according to patient size and/or use of iterative reconstruction technique. CONTRAST:  7557mMNIPAQUE IOHEXOL 350 MG/ML SOLN COMPARISON:  Brain MRI 12/27/2022 FINDINGS: CTA NECK FINDINGS SKELETON: There is no bony spinal canal stenosis. No lytic or blastic lesion. OTHER NECK: Enlarged and heterogeneous thyroid gland. UPPER CHEST: Small pleural effusions. AORTIC ARCH: There is calcific atherosclerosis of the aortic arch. There is no aneurysm, dissection or hemodynamically significant stenosis of the visualized portion of the aorta. Conventional 3  vessel aortic branching pattern. The visualized proximal subclavian arteries are widely patent. RIGHT CAROTID SYSTEM: Normal without aneurysm, dissection or stenosis. LEFT CAROTID SYSTEM: Normal without aneurysm, dissection or stenosis. VERTEBRAL ARTERIES: Left dominant  configuration. Both origins are clearly patent. There is no dissection, occlusion or flow-limiting stenosis to the skull base (V1-V3 segments). CTA HEAD FINDINGS POSTERIOR CIRCULATION: --Vertebral arteries: Normal V4 segments. --Inferior cerebellar arteries: Normal. --Basilar artery: Normal. --Superior cerebellar arteries: Normal. --Posterior cerebral arteries (PCA): Normal. ANTERIOR CIRCULATION: --Intracranial internal carotid arteries: Normal. --Anterior cerebral arteries (ACA): Normal. Both A1 segments are present. Patent anterior communicating artery (a-comm). --Middle cerebral arteries (MCA): Normal. VENOUS SINUSES: As permitted by contrast timing, patent. ANATOMIC VARIANTS: None Review of the MIP images confirms the above findings. There is contrast enhancement at the areas of abnormal hyperintense T2-weighted signal on the earlier MRI, compatible with subacute infarcts. IMPRESSION: 1. No emergent large vessel occlusion or hemodynamically significant stenosis. 2. Leptomeningeal contrast enhancement in the right frontal operculum and right occipital lobe, compatible with subacute infarcts. 3. Small pleural effusions. 4. Incidental heterogeneous and enlarged thyroid. Recommend non-emergent thyroid ultrasound. Reference: J Am Coll Radiol. 2015 Feb;12(2): 143-50 Electronically Signed   By: Ulyses Jarred M.D.   On: 12/27/2022 21:46   MR BRAIN WO CONTRAST  Result Date: 12/27/2022 CLINICAL DATA:  Acute neurologic deficit EXAM: MRI HEAD WITHOUT CONTRAST TECHNIQUE: Multiplanar, multiecho pulse sequences of the brain and surrounding structures were obtained without intravenous contrast. COMPARISON:  None Available. FINDINGS: Brain: No acute infarct, mass effect or extra-axial collection. There are cortical/subcortical hyperintense T2-weighted signal lesions of the right frontal operculum and right occipital lobe. Possible small amount of blood products associated with the more lateral right occipital lesion. CSF  spaces are normal. There is multifocal periventricular white matter hyperintensity, most often a result of chronic microvascular ischemia. The midline structures are normal. Vascular: Normal flow voids. Skull and upper cervical spine: Normal marrow signal. Sinuses/Orbits: Negative. Other: None. IMPRESSION: Cortical/subcortical lesions of the right frontal operculum and right occipital lobe. In the provided context, these are favored to represent subacute infarcts. Electronically Signed   By: Ulyses Jarred M.D.   On: 12/27/2022 21:12   CT Head Wo Contrast  Result Date: 12/27/2022 CLINICAL DATA:  Right frontal headache. EXAM: CT HEAD WITHOUT CONTRAST TECHNIQUE: Contiguous axial images were obtained from the base of the skull through the vertex without intravenous contrast. RADIATION DOSE REDUCTION: This exam was performed according to the departmental dose-optimization program which includes automated exposure control, adjustment of the mA and/or kV according to patient size and/or use of iterative reconstruction technique. COMPARISON:  None Available. FINDINGS: Brain: There is hypodensity with loss of gray-white differentiation in the right occipital lobe which could reflect subacute infarct. There is no evidence of hemorrhage or mass effect. There is no other evidence of acute infarct. There is no acute intracranial hemorrhage or extra-axial fluid collection Background parenchymal volume is normal. The ventricles are normal in size. Gray-white differentiation is otherwise preserved. The pituitary and suprasellar region are normal. There is no mass lesion there is no mass effect or midline shift. Vascular: No hyperdense vessel or unexpected calcification. Skull: Normal. Negative for fracture or focal lesion. Sinuses/Orbits: There is moderate mucosal thickening in the right sphenoid sinus with surrounding hyperostosis consistent with chronic sinusitis. T Bilateral lens implants are in place. The globes and orbits  are otherwise unremarkable. e Other: None. IMPRESSION: Possible subacute infarct in the right occipital lobe. Consider MRI for better evaluation. Electronically Signed  By: Valetta Mole M.D.   On: 12/27/2022 18:31     Medical Consultants:   None.   Subjective:    Julia Tucker feels great and complaints today  Objective:    Vitals:   12/28/22 1942 12/28/22 2327 12/29/22 0333 12/29/22 0720  BP: (!) 140/68 (!) 147/88 (!) 152/93 (!) 149/83  Pulse: 67 (!) 54 (!) 47 (!) 49  Resp: 17 16 16 18  $ Temp: 98.6 F (37 C) 97.9 F (36.6 C) 98 F (36.7 C) 98.1 F (36.7 C)  TempSrc: Oral Oral Oral Oral  SpO2: 93% 96% 99% 92%  Weight:      Height:       SpO2: 92 %   Intake/Output Summary (Last 24 hours) at 12/29/2022 [id number] Last data filed at 12/28/2022 1700 Gross per 24 hour  Intake 480 ml  Output --  Net 480 ml   Filed Weights   12/27/22 1432  Weight: 121.1 kg    Exam: General exam: In no acute distress. Respiratory system: Good air movement and clear to auscultation. Cardiovascular system: S1 & S2 heard, RRR. No JVD. Gastrointestinal system: Abdomen is nondistended, soft and nontender.  Extremities: No pedal edema. Skin: No rashes, lesions or ulcers Psychiatry: Judgement and insight appear normal. Mood & affect appropriate.   Data Reviewed:    Labs: Basic Metabolic Panel: Recent Labs  Lab 12/27/22 1551  NA 139  K 3.7  CL 106  CO2 24  GLUCOSE 93  BUN 14  CREATININE 1.11*  CALCIUM 9.1    GFR Estimated Creatinine Clearance: 59 mL/min (A) (by C-G formula based on SCr of 1.11 mg/dL (H)). Liver Function Tests: Recent Labs  Lab 12/27/22 1551  AST 23  ALT 15  ALKPHOS 96  BILITOT 0.5  PROT 7.1  ALBUMIN 3.4*    No results for input(s): "LIPASE", "AMYLASE" in the last 168 hours. No results for input(s): "AMMONIA" in the last 168 hours. Coagulation profile Recent Labs  Lab 12/27/22 2249  INR 1.3*    COVID-19 Labs  No results for input(s):  "DDIMER", "FERRITIN", "LDH", "CRP" in the last 72 hours.  No results found for: "SARSCOV2NAA"  CBC: Recent Labs  Lab 12/27/22 1551  WBC 4.5  NEUTROABS 2.4  HGB 13.2  HCT 40.3  MCV 83.3  PLT 236    Cardiac Enzymes: No results for input(s): "CKTOTAL", "CKMB", "CKMBINDEX", "TROPONINI" in the last 168 hours. BNP (last 3 results) No results for input(s): "PROBNP" in the last 8760 hours. CBG: No results for input(s): "GLUCAP" in the last 168 hours. D-Dimer: No results for input(s): "DDIMER" in the last 72 hours. Hgb A1c: No results for input(s): "HGBA1C" in the last 72 hours. Lipid Profile: Recent Labs    12/27/22 1551  CHOL 96  HDL 41  LDLCALC 41  TRIG 71  CHOLHDL 2.3    Thyroid function studies: No results for input(s): "TSH", "T4TOTAL", "T3FREE", "THYROIDAB" in the last 72 hours.  Invalid input(s): "FREET3" Anemia work up: No results for input(s): "VITAMINB12", "FOLATE", "FERRITIN", "TIBC", "IRON", "RETICCTPCT" in the last 72 hours. Sepsis Labs: Recent Labs  Lab 12/27/22 1551  WBC 4.5    Microbiology No results found for this or any previous visit (from the past 240 hour(s)).   Medications:    allopurinol  300 mg Oral Daily   aspirin EC  81 mg Oral Daily   carvedilol  25 mg Oral q AM   chlorthalidone  25 mg Oral Daily   clopidogrel  75 mg  Oral Daily   diltiazem  120 mg Oral QHS   heparin  5,000 Units Subcutaneous Q8H   losartan  100 mg Oral Q1200   rosuvastatin  40 mg Oral Daily   Continuous Infusions:    LOS: 0 days   Charlynne Cousins  [facility]  12/29/2022, 8:21 AM

## 2022-12-29 NOTE — Plan of Care (Signed)
Pt is discharging to home. DC instruction provided. PIV removed. All personal belongings returned to the pt. Pharmacy meds delivered.    Problem: Education: Goal: Knowledge of General Education information will improve Description: Including pain rating scale, medication(s)/side effects and non-pharmacologic comfort measures Outcome: Completed/Met   Problem: Health Behavior/Discharge Planning: Goal: Ability to manage health-related needs will improve Outcome: Completed/Met   Problem: Clinical Measurements: Goal: Ability to maintain clinical measurements within normal limits will improve Outcome: Completed/Met Goal: Will remain free from infection Outcome: Completed/Met Goal: Diagnostic test results will improve Outcome: Completed/Met Goal: Respiratory complications will improve Outcome: Completed/Met Goal: Cardiovascular complication will be avoided Outcome: Completed/Met   Problem: Activity: Goal: Risk for activity intolerance will decrease Outcome: Completed/Met   Problem: Nutrition: Goal: Adequate nutrition will be maintained Outcome: Completed/Met   Problem: Coping: Goal: Level of anxiety will decrease Outcome: Completed/Met   Problem: Elimination: Goal: Will not experience complications related to bowel motility Outcome: Completed/Met Goal: Will not experience complications related to urinary retention Outcome: Completed/Met   Problem: Pain Managment: Goal: General experience of comfort will improve Outcome: Completed/Met   Problem: Safety: Goal: Ability to remain free from injury will improve Outcome: Completed/Met   Problem: Skin Integrity: Goal: Risk for impaired skin integrity will decrease Outcome: Completed/Met   Problem: Education: Goal: Knowledge of disease or condition will improve Outcome: Completed/Met Goal: Knowledge of secondary prevention will improve (MUST DOCUMENT ALL) Outcome: Completed/Met Goal: Knowledge of patient specific risk  factors will improve [place]ta [place] N/A or DELETE if not current risk factor) Outcome: Completed/Met

## 2022-12-29 NOTE — TOC Transition Note (Signed)
Transition of Care [facility]) - CM/SW Discharge Note   Patient Details  Name: Julia Tucker MRN: [id number] Date of Birth: [birth year]  [facility] CM/SW Contact:  Pollie Friar, RN Phone Number: 12/29/2022, 11:52 AM   Clinical Narrative:    CM met with the patient at the bedside. She is from home with her granddaughter that works during the daytime. She is alone those hours.  No DME at home.  Pt drives self as needed.  She manages her own medications and denies any issues.  Pt states her granddaughter will provide transportation home at d/c.    Final next level of care: Home/Self Care Barriers to Discharge: No Barriers Identified   Patient Goals and CMS Choice      Discharge Placement                         Discharge Plan and Services Additional resources added to the After Visit Summary for                                       Social Determinants of Health (SDOH) Interventions SDOH Screenings   Food Insecurity: No Food Insecurity (12/28/2022)  Housing: Low Risk  (12/28/2022)  Transportation Needs: No Transportation Needs (12/28/2022)  Utilities: Not At Risk (12/28/2022)  Tobacco Use: Medium Risk (12/28/2022)     Readmission Risk Interventions     No data to display

## 2022-12-29 NOTE — Care Management Obs Status (Signed)
[place] NOTIFICATION   Patient Details  Name: Julia Tucker MRN: [id number] Date of Birth: [birth year]   Medicare Observation Status Notification Given:  Yes    Pollie Friar, RN 12/29/2022, 10:49 AM

## 2022-12-29 NOTE — Plan of Care (Signed)
  Problem: Education: Goal: Knowledge of General Education information will improve Description: Including pain rating scale, medication(s)/side effects and non-pharmacologic comfort measures Outcome: Progressing   Problem: Health Behavior/Discharge Planning: Goal: Ability to manage health-related needs will improve Outcome: Progressing   Problem: Clinical Measurements: Goal: Ability to maintain clinical measurements within normal limits will improve Outcome: Progressing   Problem: Pain Managment: Goal: General experience of comfort will improve Outcome: Progressing   Problem: Education: Goal: Knowledge of disease or condition will improve Outcome: Progressing Goal: Knowledge of patient specific risk factors will improve Julia Tucker N/A or DELETE if not current risk factor) Outcome: Progressing

## 2023-11-20 NOTE — Discharge Summary (Signed)
 "  [facility]  Discharge Summary  PCP: Nat Almarie Eaton, [place] Discharge Details   Admit date:         11/16/2023 Discharge date and time:       11/22/2023  Hospital LOS:    7 days  Active Hospital Problems   Diagnosis Date Noted POA   Chronic diastolic heart failure (*) 11/22/2023 Yes   Left ventricular hypertrophy 11/16/2023 Yes   Small airways disease 11/16/2023 Yes   Severe obesity (BMI 35.0-39.9) with comorbidity (*) 11/16/2023 Yes   Enlarged pulmonary artery (*) 11/07/2023 Yes   History of CVA (cerebrovascular accident) 11/07/2023 Not Applicable   Controlled type 2 diabetes mellitus with chronic kidney disease (*) 06/06/2023 Yes   PSVT (paroxysmal supraventricular tachycardia) (*) 12/16/2022 Yes   Stage 3b chronic kidney disease (*) 12/16/2022 Yes   Prediabetes 12/16/2022 Yes   OSA (obstructive sleep apnea) 10/29/2020 Yes   Chronic viral hepatitis C (*) 11/18/2015 Yes    Resolved Hospital Problems   Diagnosis Date Noted Date Resolved POA   *Acute congestive heart failure with left ventricular diastolic dysfunction (*) 11/16/2023 11/22/2023 Yes   Pleural effusion on right 11/16/2023 11/22/2023 Yes    Discharge Medication List as of 11/22/2023 12:19 PM     DISCONTINUED medications     diltiazem  (TIAZAC ) 120 MG 24 hr capsule      furosemide  (LASIX ) 20 mg tablet        NEW medications   Details  bumetanide  (BUMEX ) 2 mg tablet Take one tablet (2 mg dose) by mouth 2 (two) times daily., Starting Wed 11/22/2023, Normal    calcium  carbonate (TUMS) 500 mg chewable tablet Chew one tablet (500 mg dose) by mouth 3 (three) times a day as needed., Starting Wed 11/22/2023, Until Thu 11/21/2024 at 2359, Normal       CHANGED medications   Details  amLODIPine  besylate (NORVASC ) 5 mg tablet Take one tablet (5 mg dose) by mouth daily., Starting Thu 11/23/2023, Normal    carvedilol  (COREG ) 6.25 mg tablet  Take one tablet (6.25 mg dose) by mouth 2 (two) times daily., Starting Wed 11/22/2023, Normal    rosuvastatin  calcium  (CRESTOR ) 10 mg tablet Take one tablet (10 mg dose) by mouth at bedtime., Starting Wed 11/22/2023, Normal       CONTINUED medications   Details  albuterol  sulfate HFA (PROVENTIL ,VENTOLIN ,PROAIR ) 108 (90 Base) MCG/ACT inhaler Inhale two puffs into the lungs every 6 (six) hours as needed., Starting Fri 04/07/2023, Until Fri 11/15/2024 at 2359, Historical Med    allopurinol  (ZYLOPRIM ) 300 mg tablet Take one tablet (300 mg dose) by mouth every morning., Starting Wed 02/16/2022, Historical Med    aspirin  (ECOTRIN LOW DOSE) EC tablet Take one tablet (81 mg dose) by mouth daily., Starting Sun 12/18/2022, Until Mon 12/18/2023, Normal    budesonide-formoterol (SYMBICORT) 160-4.5 mcg/actuation inhaler Inhale two puffs into the lungs 2 (two) times daily., Starting Tue 11/07/2023, Normal    cetirizine (ZYRTEC) 10 mg tablet Take one tablet (10 mg dose) by mouth daily., Starting Fri 04/07/2023, Until Sat 04/06/2024, Historical Med    Cholecalciferol (D3-50) 1.25 MG (50000 UT) capsule Take one capsule (50,000 Units dose) by mouth once a week at 0900., Starting Fri 01/06/2023, Historical Med    losartan  potassium (COZAAR ) 100 mg tablet Take one tablet (100 mg dose) by mouth daily., Starting Wed 02/16/2022, Historical Med    POTASSIUM CHLORIDE  10 MEQ tablet Take two tablets (20 mEq dose) by mouth daily.,  Starting Tue 04/05/2022, Historical Med    tafamidis  (VYNDAMAX ) 61 mg CAPS capsule Take one capsule (61 mg dose) by mouth daily., Starting Mon 11/20/2023, Normal    trimethoprim-polymyxin b (POLYTRIM) ophthalmic solution Place one drop into both eyes every 6 (six) hours for 7 days., Starting Thu 11/16/2023, Until Thu 11/23/2023, Normal        Reason for medication changes: Diltiazem  and Lasix  were discontinued per advanced heart failure cardiology's recommendations   Hospital Course   Indication for  Admission/chief complaint: Leg swelling, shortness of breath  Hospital Course:       Mrs. Kuehnel is a 78 y/o female with a hx of HTN, HLD, hypertrophic CM, OSA, Venous insufficiency, restrictive airway disease, hx CVA and somewhat persistent R pleural effusion who presents from PCP office due to exertional hypoxia in the office. She has been struggling with LE swelling for several weeks without improvement on lasix  titration (20mg ->40mg  daily). Of note, there has been concern with primary cardiologist for HOCM vs concentric biventricular hypertrophy c/f amyloid. Very briefly, she required 2L By [place]gett which was weaned with diuresis and R thoracentesis (fluid studies w/o infectious etiology). LE swelling and dyspena improved. Adv HF team was consulted. PYP confirmed cardiac amyloid. She will have close follow up with HF clinic. Found to have thyroid nodule, TSH/t4 WNL, will need 1 year surveillance.  She was discharged back home.  Hospital course detailed below.  Problem based Hospital course  Acute congestive heart failure with left ventricular diastolic dysfunction Left ventricular hypertrophy with confirmed amyloidosis Right ventricular dysfunction Patient presents with chief complaints of leg swelling and shortness of breath with worsening edema to bilateral lower extremity was started on furosemide  20 mg every other day with progressive edema and weight gain.  Furosemide  was increased to 40 mg daily on 11/07/2023 and patient given urgent care referral to [facility] chest.  She was seen by her primary care provider's office again on 11/16/2023 with some improvement in weight but still with significant edema and ongoing dyspnea worse with exertion with oxygen saturation dropping to 83% with mild exertion because of her symptoms she was referred to the ER for further evaluation.  D-dimer elevated, chest x-ray showed no acute changes from most recent chest x-ray from 11/07/2023 with right pleural effusion plus or  minus mass, possible pulmonary edema.  Underwent CT angio pulmonary with no evidence of pulm embolism.  Large left thyroid nodule versus mass She was started on GDMT per advanced heart failure with Bumex , Coreg  and losartan .  On admission she weighed 240 pounds at discharge her weight was 218 pounds.  Cardiology plan to start Fafamidis in outpatient setting   Persistent right pleural effusion ~Band like areas of volume loss in the mid lung zones bilaterally as well as the lung bases Small airway disease large pulmonary arteries This has been noted in the past, with plans for urgent Pulm referral which has not happened.  CT of the chest negative for PE but did show large right pleural effusion.  Underwent ultrasound thoracentesis; right (1.8 L of fluid removed).  Chest x-ray postthoracentesis showed resolved pleural effusion and no pneumothorax.  No growth on culture.  Lights criteria with transudative fluid  Thyroid nodule versus mass Thyroid nodule vs mass TSH/Free normal Ultrasound thyroid, bilateral thyroid nodule and per guideline patient require 1 year follow-up with imaging.  Patient was informed of these nodules and was told she will need to follow-up with her doctor to have repeat imaging in 1 year  Paroxysmal supraventricular tachycardia Continue coreg  for rate control   Hypertension BP in good range Continue losartan , coreg , amlodipine    Stage IIIb CKD Baseline creatinine 1.1-1.2 Creatinine remained at baseline  Crusting of right eye Polytrim x 7 days.  Crusting of the right eye is resolved but will continue 7-day course until 1/16  Recommendations to physicians/followup needed:  - cardiology follow up for cardiac amyloid - follow up PCP.  - 1 year thyroid follow up, with PCP with repeat thyroid ultrasound  Physical Exam: Vitals:   11/22/23 0756  BP: 141/73  Pulse: 54  Resp: 18  Temp: 98.2 F (36.8 C)  SpO2: 95%   Physical Exam Constitutional:      Appearance:  She is obese.  HENT:     Head: Normocephalic.     Right Ear: External ear normal.     Left Ear: External ear normal.     Nose: Nose normal.     Mouth/Throat:     Mouth: Mucous membranes are moist.  Eyes:     Conjunctiva/sclera: Conjunctivae normal.     Pupils: Pupils are equal, round, and reactive to light.  Cardiovascular:     Rate and Rhythm: Normal rate and regular rhythm.     Pulses: Normal pulses.     Heart sounds: Normal heart sounds.  Pulmonary:     Effort: Pulmonary effort is normal.     Breath sounds: Normal breath sounds.  Abdominal:     General: Bowel sounds are normal.     Palpations: Abdomen is soft.  Musculoskeletal:        General: Normal range of motion.     Cervical back: Normal range of motion.  Skin:    General: Skin is warm and dry.  Neurological:     Mental Status: She is alert and oriented to person, place, and time.  Psychiatric:        Mood and Affect: Mood normal.        Behavior: Behavior normal.     Labs on Discharge:  Recent Labs    Units 11/18/23 0613 11/17/23 0413 11/16/23 1301  WBC thou/mcL 4.7 3.7* 4.3  HGB gm/dL 86.3 86.5 85.6  HCT % 59.9 41.3 43.7  PLT thou/mcL 182 183 202   Recent Labs    Units 11/22/23 0107 11/21/23 1130 11/20/23 0400 11/19/23 0130 11/18/23 0613 11/17/23 0413 11/16/23 1301  NA mmol/L 140 141 141 142 142 143 143  K mmol/L 3.7 4.0 3.3* 3.2* 3.5* 3.7 3.8  CL mmol/L 103 104 101 103 105 107 107  CO2 mmol/L 28 27 30 28 29 26 28   BUN mg/dL 21 20 17 16 15 16 13   CREATININE mg/dL 8.70* 8.62* 8.59* 8.67* 1.30* 1.20* 1.10*  CALCIUM  mg/dL 9.2 9.2 9.0 8.8 8.9 9.1 9.4   Recent Labs    Units 11/20/23 0400 11/18/23 0613 11/17/23 1319 11/17/23 0413 11/16/23 1301  BILITOT mg/dL 0.7  --   --  0.7 0.8  AST U/L 12  --   --  16 17  ALT U/L 10  --   --  11 10  ALKPHOS U/L 98  --   --  118 127  ALBUMIN gm/dL 3.3* 3.4  --  3.5 3.9  LDH U/L  --   --  310*  --   --    Recent Labs    Units 11/16/23 1301  TSH  mcIU/mL 1.22   No results for input(s): LABPROT, INR, PTT in the last 168 hours. No results  for input(s): CHOL, LDL, HDL, TRIG in the last 168 hours. No results for input(s): TROPONIN, CK in the last 168 hours.  Invalid input(s): CK-MB  Diagnostics:   XR Chest Pa And Lateral   Result Date: 11/16/2023 Clinical information: Shortness of breath and reported leg swelling. Elevated BNP. No elevated white blood cell count or reported fever. Comparison: 11/07/2023 Technique: Heart is stable in size. Bilateral Findings: Heart is stable in size. There is pulmonary vascular congestion with perihilar and right basilar airspace disease and  right pleural effusion. Findings similar to prior exam.    Impression: Pulmonary findings suspected to represent asymmetric pulmonary edema with a right pleural effusion, similar to prior exam. CT chest with contrast can be performed to exclude a right lung mass with right pleural effusion. Electronically Signed by: Wash Pereyra on 11/16/2023 2:13 PM   CT Angio Pulmonary   Result Date: 11/17/2023 INDICATION: Shortness of breath. COMPARISON:  None. TECHNIQUE:  CT ANGIO PULMONARY was performed during IV administration of contrast. Contrast: 75 mL IOPAMIDOL 76 % IV SOLN   Image post processing was performed creating multi planar 2D and angiographic 3D MIP, shaded surface rendering or 3D volume rendering images for review and comparison. Radiation dose reduction was utilized (automated exposure control, mA or kV adjustment based on patient size, or iterative image reconstruction). Contrast bolus quality:  satisfactory FINDINGS: No evidence for pulmonary embolism or aortic dissection. The trachea and mainstem bronchi are patent. Large right pleural effusion. Bilateral subsegmental atelectasis with compressive atelectasis in the right lower lobe. Large left thyroid nodule versus mass. No adenopathy. Cardiomegaly. Small left pleural effusion. Right renal calculus.  Left renal cortical cyst. Spondylosis.    IMPRESSION:   No evidence for pulmonary embolism. Large left thyroid nodule versus mass. Other findings as above. Electronically Signed by: Sheppard Charm, MD on 11/17/2023 11:48 AM   US  Thoracentesis Right   Result Date: 11/17/2023 CLINICAL INDICATION: Right pleural effusion TECHNIQUE: A preprocedural pause was performed and two patient identifiers were verified. After obtaining informed consent, the patient's posterior chest was evaluated under ultrasound guidance which identified a large  right pleural effusion. An appropriate site for the thoracentesis was identified with ultrasound images obtained. The area was prepped and draped in the usual sterile fashion. Both the superior and inferior ribs were localized in the accessed region and care was taken to direct the needle just above the inferior rib. Local anesthesia was obtained using buffered lidocaine . An Angiocath was advanced into the pleural space under direct ultrasound guidance. With the initial flashback, the catheter was advanced over the needle. 180 mL serous fluid was sent for diagnostic laboratory evaluation. The catheter was then connected to wall suction and the drainage was performed. At the completion of the drainage the catheter was removed and a sterile dressing was applied. FINDINGS: Ultrasound of the posterior chest identified a large  right pleural effusion. A total of 1180 cc of clear amber fluid was obtained. The patient tolerated the procedure well and there were no immediate complications. Permanent ultrasound images were obtained.    IMPRESSION: Successful ultrasound guided diagnostic and therapeutic right thoracentesis. Electronically Signed by: Morene Gulling on 11/17/2023 5:06 PM   XR Chest Post Procedure   Result Date: 11/17/2023 INDICATION: Status post right thoracentesis COMPARISON: Radiographs of the chest 11/16/2023 TECHNIQUE: 2 frontal views of the chest FINDINGS: Resolved  right pleural effusion and improved aeration of the right lung base following thoracentesis. There is no pneumothorax. Prominent hilar contours persists. Bilateral opacities.  IMPRESSION: Resolved right pleural effusion and improved aeration of the right lung base following thoracentesis. No pneumothorax. Electronically Signed by: Bernard LULLA Blanch MD, MBA on 11/17/2023 3:54 PM   Echocardiogram Complete WO Enhancing Agent   Result Date: 11/17/2023    Left Ventricle: EF: 65-70%. Quantitative analysis of left ventricular Global Longitudinal Strain (GLS) imaging is -14.800%. Strain imaging pattern suggests cardiac amyloidosis. Ejection fraction measured by 3D is 66%, which is normal.   Left Ventricle: There is severe concentric hypertrophy, septum measures 2.5 cm and lateral wall 2.0 cm.   Tricuspid Valve: The right ventricular systolic pressure is severely elevated (>60 mmHg).   Pericardium: There is a trivial pericardial effusion noted.   Right Ventricle: Right ventricle is mildly dilated with mildly reduced RV systolic function    US  Venous Lower Extremity Bilateral   Result Date: 11/16/2023 TECHNIQUE: The veins of both lower extremities were interrogated from the visible common femoral vein to the distal popliteal vein.  The junction of the greater saphenous with the common femoral vein as well as the posterior tibial veins were evaluated.  Gray scale, color, spectral, and doppler sonography was utilized and analyzed. COMPARISON: 01/20/2015.  INDICATION: LE swelling/pain, r/o DVT Swelling, FINDINGS: Flow is present in all interrogated vessels and there are no internal echoes.  Normal venous compressibility is demonstrated and there is augmentation of flow with appropriate maneuvers.     IMPRESSION:  No evidence of deep venous thrombosis in the bilateral lower extremities. Electronically Signed by: Wash Pereyra on 11/16/2023 3:39 PM    US  Thyroid   Result Date: 11/18/2023 COMPARISON:  December 16, 2022  CTA  INDICATION: Mass  TECHNIQUE: Thyroid ultrasound was performed, utilizing grayscale and color Doppler sonography. FINDINGS: - Right thyroid lobe measures 5.2 x 2.2 x 2.5 cm. - Left thyroid lobe measures 6.8 x 3.4 x 3.7 cm. - Thyroid isthmus measures 4 mm. - Normal thyroid vascularity. - Homogeneous parenchyma. - Nodules: 1.  Right lower 3.2 x 1.9 x 2.1 cm mixed cystic isoechoic nodule, TR 2. 2.  Left lobe 4.9 x 3.1 x 3.5 cm predominantly solid isoechoic nodule, TR 3.    IMPRESSION: Bilateral thyroid nodules as above. Follow-up in one year per standard guidelines. Thyromegaly. ACR TI-RADS GUIDELINES: TR5 (>=7 points) - FNA if >= 1cm, follow-up if 0.5 - 0.9 cm every year for 5 years TR4 (4-6 points) - FNA if >= 1.5cm, follow-up if 1 - 1.4 cm in 1, 2, 3 and 5 years TR3 (3 points)- FNA if >= 2.5cm, follow-up if 1.5 - 2.4 cm in 1, 3 and 5 years TR2 (2 points) & TR1 (0 points) - No FNA or follow-up # ACR TI-RADS recommends that no more than two nodules with the highest ACR TI-RADS total point should be biopsied and no more than four nodules should be followed. Electronically Signed by: Lynwood Murders, DO on 11/18/2023 12:40 PM   NM PYP Amyloid Spect w/CT   Result Date: 11/19/2023 INDICATION: Amyloid. TECHNIQUE: NM PYP AMYLOID SPECT W/CT. 22 mCi technetium PYP. CT images obtained for attenuation correction and localization purposes. SPECT images generated. Exam date/time: 11/18/2023 11:56 AM.  Comparison none. FINDINGS: Visually the uptake is grade 2. The heart to the contralateral lung ratio is 1.34. There are bilateral pleural effusions. There are bilateral lung infiltrate. Cardiomegaly.    IMPRESSION: 1. Bilateral pleural effusions and bilateral lung infiltrates. 2. The semiquantitative visual score is 2. This is suggestive of amyloid deposition Electronically Signed by: Glendia Guillaume, MD on 11/19/2023 8:45  AM   Post Hospital Care   Discharge Procedure Orders  Follow-up with Primary Care Physician   Referral Priority: Urgent Referral Type: Consultation  Referral Reason: Evaluate and Return  Number of Visits Requested: 1 Expiration Date: 05/19/24   Cardiac diet (heart healthy)  Order Comments: 2 grams sodium (low salt diet)   Instruct patient to contact provider for signs or symptoms of increased chest pain.  Order Comments: Instruct patient to contact provider for signs or symptoms of increased chest pain  and/or shortness of breath. For emergent needs call 911.   Notify Physician for trouble breathing or symptoms that are worse   Notify Physician for increased abdominal swelling or bloating   Notify Physician for increased shortness of breath   Notify Physician for Weight gain  Order Comments: 2-3 lbs overnight, 5 lbs in 5 days   Notify Physician for swelling in your ankles, feet, hands, neck or face (Heart Failure)   Notify Physician for palpitations (Fluttering in your chest)   Notify Physician for dizziness when trying to stand   Notify Physician and CALL 911 if you experience Chest Pain or discomfort, especially if associated with tiredness, sick on stomach or sweaty feeling.   Notify Physician and Call 911 if you experience any of the following: Sudden numbness or weakness, sudden trouble speaking, vision problems, trouble walking, sudden severe headache with no known cause.   Discharge instructions  Order Comments: Myalynn Lingle you are medically stable and ready to be discharged home.  Cardiology advanced heart failure service is also recommended you discharge.  You were referred to follow-up with your primary care doctor within 1 week please keep those appointments.  Cardiology/advanced heart failure outpatient clinic will also place an appointment for you to follow-up  Please take your medications as prescribed.  If you have any questions please call your primary care doctor or the cardiology outpatient clinic   CHF patient instructions  Order Comments:  Do the following to manage your overall health at home: 1. Follow up with your primary care provider within 7 days. 2. Avoid smoking or being around smoke. 3. Wash your hands often, especially after contact with other people and before eating. 4. Avoid being around people who are sick if possible, and get plenty of sleep. 5. Take all of your medications as prescribed.  If you feel the need to discuss medication changes with any of your outpatient providers first, please do so as soon as possible.  If you experience any side effects, contact your healthcare provider right away.  Do the following to manage your congestive heart failure at home:  6. Follow up with your cardiologist/heart failure clinic within 1 week.  7. Avoid excess salt. Food labels include sodium/salt content. Recommend to take in no more than 2 grams (2000mg ) of sodium in a total day.   8. Weigh yourself every morning. Call for weight gain >3 lbs above your normal/dry weight. 9. Avoid NSAIDs (avoid ibuprofen =Motrin , naprosyn=Aleve, and other NSAIDs). Tylenol  is safe for minor aches/pains or fever when taken per bottle instructions.   10. Follow your healthcare provider's recommendations for periodic monitoring of your kidney function and electrolytes (sodium, potassium, and magnesium levels).  11. Cardiac rehab may make you feel better and improve activity tolerance. Discuss this with your cardiologist at follow up.  12. Call your healthcare provider/seek medical attention if you experience any new or worsening:   - Shortness of breath  - Pain, pressure, tightness, or heaviness in your chest,  arm, neck, jaw, or upper abdomen  - Dizziness/lightheadedness  - Sensation you may pass out   - Weight gain >3 lbs above your normal/dry weight  - Increased swelling/edema    Diet: Cardiac diet (heart healthy)  Potential for Rehab:        Good Code Status:   Full Code Disposition: Home Consults: Advanced Heart Failure   Followup  appointments:  Future Appointments  Date Time Provider Department Center  11/24/2023  9:00 AM Nat Adine Cedar, PA-C [facility] None  11/30/2023  3:00 PM Marcello MARLA Lennox, MD [facility] None  12/05/2023  1:00 PM Krystal FORBES [place], PA [facility] None  12/13/2023  9:30 AM Cindia KANDICE Dolores, MD [facility] None  01/24/2024  1:45 PM Norval Roy, MD [facility] None     Time spent in discharge process:  total time spent 35 minutes   Electronically signed: Arland JULIANNA Faden, FNP 11/22/2023 / 3:49 PM   "

## 2024-06-03 NOTE — Progress Notes (Signed)
 " Subjective   Patient ID:  Julia Tucker is a 78 y.o. (DOB 09/26/1946) female    Patient presents with   Medicare Wellness Visit    Patient presents for her annual wellness visit, annual physical and follow-up on chronic medical conditions.   -essential HTN- BP has been well controlled. Carvedilol  6.25mg  BID, amlodipine  5mg , losartan  100mg    -hyperlipidemia- crestor  10mg     -Asthma- Didn't feel her asthma was contributing to her previous SOB. Breo discontinued, continue albuterol  to use PRN. PFTs have been normal.    -Cardiac amyloidosis/diastolic heart failure- hx of pleural effusion. SOB improved once this was treated. She is followed by cardiology and currently taking Vyndamax  and wainua. Carvedilol  and bumex  BID, last saw cardiology 03/29/2024 and will be seeing them again later this week.   -She notes chronic left leg swelling since her left knee replacement. Seems to be more swollen now. She has not been monitoring her weight daily. She states she went on a trip back in March and hasn't been able to loose weight since then. She denies increased SOB.  double Bumex  dosage from 2 mg to 4 mg twice daily for 2 days. She has gained 8 pounds in two months. She does have history of venous insufficiency. - Goal: dry weight closer to 215 pounds.    -gout- allopurinol  daily, no recent gout flares   -hx of CKD- check labs  -History of stroke- History of stroke and is followed by [facility]. Currently taking aspirin  and Crestor  40mg    -Osteoarthritis- Severe osteoarthritis of both hips, recommended total hip arthroplasty which she does not want to pursue. Prescribed gabapentin for pain which was not helpful. Not interested in hip replacement due to lack of support for recovery. Discontinued physical therapy due to ineffectiveness. Interval update: she saw orthopedics on 05/22/24 who felt pain was due to greater trochanteric bursitis and recommended PT. Xrays showed mild-moderate OA  at her visit but based on exam did not feel this was the source of her pain. Advised OTC voltaren gel, tylenol , cool compresses and avoid lying on each hip. Would not recommend intra-bursal steroid injections as she has previously had significant itching from steroid injections.   -since that visit she states she is having pain radiating down bilateral legs L>R to her foot. She has some intermittent lower back pain and endorses ongoing bilateral hip pain. Mostly bothers her at night. No known injury. She states she has fell a couple times but without injury. She is unsure why she fell. She has taken 1,000mg  tylenol  before bed. Pain is inhibiting her ability to sleep. Unable to take NSAIDs due to CKD and CHF. Scheduled to start PT 06/18/24.   -hx of hepatitis C- treated with Mavyret for 2 months with negative RNA after completion of treatment   -pre-diabetes- check labs  -OSA- there was concern for possible OSA when admitted but once she was euvolemic her pulmonary hypertension had resolved so sleep study not performed.   -thyroid nodule- negative biopsy 04/2024, repeat thyroid US  annually   -constipation- hasn't tried anything for this, worse of the past 1-2 months  Preventative Screenings:  -Patient's last mammogram was 10/13/2022.  -Patient's last colonoscopy was 12/31/2012 and per records repeat due 2019. This was postponed due to COVID and never done. No more due to age and co-morbidities.  -Patient's last DEXA was 01/19/2021 and showed osteopenia. She would like to repeat.   Immunizations: She is not up-to-date on all immunizations. We discussed shingrix and  RSV vaccine as well as last dose pf hep B vaccine.   Routine Labs: She has not had routine labs. She has had some crackers and cheese.   Past Medical History, Past Surgery History, Allergies, Social History, and Family History were reviewed and updated.   Problem List[1] Allergies[2]  Review of Systems  Constitutional:   Negative for appetite change, fatigue, fever and unexpected weight change.  Eyes:  Negative for visual disturbance.  Respiratory:  Negative for cough, shortness of breath and wheezing.   Cardiovascular:  Positive for leg swelling. Negative for chest pain and palpitations.  Gastrointestinal:  Negative for abdominal pain, constipation, diarrhea, nausea and vomiting.  Genitourinary:  Negative for dysuria and frequency.  Musculoskeletal:  Positive for arthralgias.  Skin:  Negative for rash.  Neurological:  Negative for headaches.  Hematological:  Negative for adenopathy. Does not bruise/bleed easily.     Objective   Vitals:   06/04/24 0912  BP: 110/60  Pulse: 60  Height: 5' 8 (1.727 m)  Weight: 223 lb 14.4 oz (101.6 kg)  SpO2: 96%  BMI (Calculated): 34.1  PainSc:   6  PainLoc: Comment: Hips and legs    Physical Exam Constitutional:      General: She is not in acute distress.    Appearance: She is well-developed.  HENT:     Right Ear: Tympanic membrane, ear canal and external ear normal.     Left Ear: Tympanic membrane, ear canal and external ear normal.     Nose: Nose normal.     Mouth/Throat:     Pharynx: Uvula midline. No oropharyngeal exudate or posterior oropharyngeal erythema.   Eyes:     Conjunctiva/sclera: Conjunctivae normal.     Pupils: Pupils are equal, round, and reactive to light.    Cardiovascular:     Rate and Rhythm: Normal rate and regular rhythm.     Heart sounds: Normal heart sounds. No murmur heard.  Musculoskeletal:        General: Normal range of motion.     Cervical back: Normal range of motion and neck supple.     Right lower leg: Edema (1+) present.     Left lower leg: Edema (1+) present.     Comments: TTP to bilateral hips, mild TTP to lower lumbar spine  Pulmonary:     Effort: Pulmonary effort is normal. No respiratory distress.     Breath sounds: Normal breath sounds. No wheezing or rales.  Abdominal:     General: Bowel sounds are  normal. There is no distension.     Palpations: Abdomen is soft.     Tenderness: There is no abdominal tenderness. There is no guarding or rebound.  Lymphadenopathy:     Head:     Right side of head: No submental, submandibular, tonsillar, preauricular, posterior auricular or occipital adenopathy.     Left side of head: No submental, submandibular, tonsillar, preauricular, posterior auricular or occipital adenopathy.     Cervical: No cervical adenopathy.   Skin:    General: Skin is warm and dry.     Findings: No rash.  Neurological:     Mental Status: She is alert and oriented to person, place, and time.     Cranial Nerves: No cranial nerve deficit.     Coordination: Coordination normal.   Psychiatric:        Behavior: Behavior normal.        Thought Content: Thought content normal.      Assessment and Plan  1.  Medicare annual wellness visit, subsequent (Primary) see attached 2. Annual physical exam See HPI 3. Amyloid heart disease (*) Continue to follow with cardiology 4. Chronic diastolic heart failure (*) Patient's weight is about 8 pounds higher than her dry weight.  She has some mild bilateral edema.  I have advised her to increase her Bumex  to 4 mg twice daily until she sees her cardiologist in 2 days.  Discussed that her lower extremity edema could also be due to venous insufficiency and Bumex  will not be helpful for this.  Encourage patient to wear compression stockings and elevate feet above level of heart.  Discussed that we do not want to continue high dose Bumex  if not needed due to her kidney function. -     Comprehensive Metabolic Panel 5. PSVT (paroxysmal supraventricular tachycardia) (*) Need to follow-up with cardiology 6. Essential hypertension Well-controlled continue current medications -     Comprehensive Metabolic Panel 7. Hyperlipidemia, unspecified hyperlipidemia type -     Comprehensive Metabolic Panel -     Lipid Panel 8. Severe obesity (BMI  35.0-39.9) with comorbidity (*) Counseled on diet modifications 9. Prediabetes -     Hemoglobin A1c 10. Osteopenia, unspecified location Will repeat DEXA now 11. Stage 3b chronic kidney disease (*) -     Comprehensive Metabolic Panel -     CBC -     Hemoglobin A1c 12. Vitamin D deficiency -     Vitamin D 25 hydroxy 13. History of gout No recent gout flares 14. History of CVA (cerebrovascular accident) Continue aspirin  and statin 15. Thyroid nodule Plan to repeat thyroid ultrasound in 1 year 16. Venous insufficiency (chronic) (peripheral) See above, encouraged compression stockings and leg elevation 17. Bilateral hip pain Patient plans to try physical therapy again.  We did discuss referral to pain clinic however she declines at this time.  She will continue Tylenol  as needed. 18. Constipation, unspecified constipation type MiraLAX daily as needed.  Notify office if no improvement or worsening. -     polyethylene glycol (MIRALAX,GAVILAX,CLEARLAX) 17 GM/SCOOP powder 19. Acute midline low back pain with bilateral sciatica Will try lidocaine  patch and if worsening consider imaging.  Continue with physical therapy -     lidocaine  (LIDODERM ) 5% 20. Encounter for screening for malignant neoplasm of breast, unspecified screening modality -     Mammo 3D Tomo Screening Bilateral 21. Postmenopausal -     Dexa Bone Density Axial Skeleton  Follow up in about 3 months (around 09/04/2024), or if symptoms worsen or fail to improve, for 30 minutes.    Patient's Medications       * Accurate as of June 04, 2024 12:46 PM. Reflects encounter med changes as of last refresh          New Prescriptions      Instructions  lidocaine  5% Commonly known as: LIDODERM  Started by: Nat Eaton  1 patch, Transdermal, Every 12 hours   polyethylene glycol 17 GM/SCOOP powder Commonly known as: MIRALAX,GAVILAX,CLEARLAX Started by: Nicole Gentile  17 g, Oral, Daily       Continued Medications       Instructions  albuterol  sulfate HFA 108 (90 Base) MCG/ACT inhaler Commonly known as: PROVENTIL ,VENTOLIN ,PROAIR   2 puffs, Every 6 hours as needed   allopurinol  300 mg tablet Commonly known as: ZYLOPRIM   300 mg, Oral, Every morning   amLODIPine  besylate 5 mg tablet Commonly known as: NORVASC   5 mg, Oral, Daily   aspirin  81 mg chewable tablet  81 mg, Daily  bumetanide  2 mg tablet Commonly known as: BUMEX   2 mg, Oral, 2 times a day   carvedilol  6.25 mg tablet Commonly known as: COREG   6.25 mg, Oral, 2 times a day   losartan  potassium 100 mg tablet Commonly known as: COZAAR   100 mg, Oral, Daily   potassium chloride  10 mEq crys ER tablet Commonly known as: KLOR-CON  M10  20 mEq, Oral, Daily   rosuvastatin  calcium  10 mg tablet Commonly known as: CRESTOR   10 mg, Oral, At bedtime   VITAMIN A PO  2,400 mcg, Daily   VYNDAMAX  61 MG Caps capsule Generic drug: tafamidis   61 mg, Oral, Daily   WAINUA 45 MG/0.8ML Soaj Generic drug: Eplontersen Sodium  Inject 1 pen (45mg  dose) into the skin every 30  days.          Risks, benefits, and alternatives of the medications and treatment plan prescribed today were discussed, and patient expressed understanding. Plan follow-up as discussed or as needed if any worsening symptoms or change in condition.          [1] Patient Active Problem List Diagnosis   Hyperlipidemia   Essential hypertension   Leg swelling   Hypertensive cardiomyopathy- possible   Cataract, nuclear sclerotic, left eye   Cataract, nuclear sclerotic, right eye   PSVT (paroxysmal supraventricular tachycardia) (*)   Prediabetes   History of gout   Stage 3b chronic kidney disease (*)   History of CVA (cerebrovascular accident)   Enlarged pulmonary artery (*)   Primary osteoarthritis of both hips   Severe obesity (BMI 35.0-39.9) with comorbidity (*)   Left ventricular hypertrophy   Small airways disease   Anxiety   Chronic  bilateral low back pain without sciatica   Chronic right shoulder pain   History of hepatitis C   Gastroesophageal reflux disease without esophagitis   Genital herpes   Hypertrophic cardiomyopathy (*)   Iliotibial band syndrome   Osteopenia   Proteinuria   Venous insufficiency (chronic) (peripheral)   Vitamin D deficiency   Chronic diastolic heart failure (*)   Amyloid heart disease (*)   Thyroid nodule  [2] Allergies Allergen Reactions   Lisinopril Other    Swelling   Swelling  Possible swelling   Clonidine Other    drowsiness   Triamcinolone Acetonide Rash  *Some images could not be shown."

## 2024-09-05 NOTE — Progress Notes (Signed)
 " Subjective   Patient ID:  Julia Tucker is a 78 y.o. (DOB [birth year]) female    Patient presents with   Follow-up    Patient presents for 3 month follow-up. Her son unfortunately passed away unexpectedly on 09/01/24, suspected MI. He did live with her. She has two living daughters, one here and one in [place].   -essential HTN- BP has been well controlled. Carvedilol  6.25mg  BID, amlodipine  5mg , losartan  100mg    -hyperlipidemia- crestor  10mg .  She is fating.    -Asthma- Didn't feel her asthma was contributing to her previous SOB. Breo discontinued, continue albuterol  to use PRN. PFTs have been normal.    -Cardiac amyloidosis/diastolic heart failure- hx of pleural effusion. SOB improved once this was treated. She is followed by cardiology and currently taking Vyndamax  and amvuttra (just started this week). Carvedilol  and bumex  BID, last saw cardiology 06/2024. She has not needed metolazone. She states her weight is up and down and she relates this to her diet. Has chronic swelling of left leg (hx of venous insufficiency). She has not been wearing compression stocking. No recent SOB.  She has gained 4 pounds since July.    -gout- allopurinol  daily, no recent gout flares    -hx of CKD 3b- elevated PTH, normal calcium . Referral to nephrology has been placed. Will re-check labs today. Scheduled to see nephrology in December  -elevated alk phos- no bone pain, normal GGT, elevated PTH. Has not yet had boen density. Will re-check labs today.    -History of stroke- History of stroke and is followed by [facility]. Currently taking aspirin  and Crestor  40mg    -Osteoarthritis- Severe osteoarthritis of both hips, recommended total hip arthroplasty which she does not want to pursue. Prescribed gabapentin for pain which was not helpful. Not interested in hip replacement due to lack of support for recovery. Discontinued physical therapy due to ineffectiveness. Interval update: she saw  orthopedics on 05/22/24 who felt pain was due to greater trochanteric bursitis and recommended PT. Xrays showed mild-moderate OA at her visit but based on exam did not feel this was the source of her pain. Advised OTC voltaren gel, tylenol , cool compresses and avoid lying on each hip. Would not recommend intra-bursal steroid injections as she has previously had significant itching from steroid injections. She continues with PT. She had to miss her last appointment due to her sons death. Continues to have a lot of pain in hips and legs. Wants to avoid narcotics. Tylenol  arthritis helps some.   -hx of hepatitis C- treated with Mavyret for 2 months with negative RNA after completion of treatment    -pre-diabetes- check labs   -thyroid nodule- negative biopsy 04/2024, repeat thyroid US  annually   Past Medical History, Past Surgery History, Allergies, Social History, and Family History were reviewed and updated.   Problem List[1] Allergies[2]  Review of Systems  Constitutional:  Negative for fatigue and fever.  Respiratory:  Negative for cough and shortness of breath.   Cardiovascular:  Positive for leg swelling (chronic). Negative for chest pain.  Gastrointestinal:  Negative for abdominal pain, constipation, diarrhea and nausea.  Musculoskeletal:  Positive for arthralgias.  Skin:  Negative for rash.     Objective   Vitals:   09/05/24 0835  BP: 120/60  Patient Position: Sitting  Pulse: 60  Weight: 227 lb 12.8 oz (103.3 kg)  SpO2: 97%    Physical Exam Constitutional:      General: She is not in acute distress.    Appearance:  She is well-developed.  Eyes:     Conjunctiva/sclera: Conjunctivae normal.  Cardiovascular:     Rate and Rhythm: Normal rate and regular rhythm.     Heart sounds: Normal heart sounds. No murmur heard. Musculoskeletal:     Right lower leg: Edema (trace) present.     Left lower leg: Edema (1+) present.  Pulmonary:     Effort: Pulmonary effort is normal. No  respiratory distress.     Breath sounds: Normal breath sounds. No wheezing or rales.  Skin:    General: Skin is dry.     Findings: No rash (of exposed skin).  Neurological:     Mental Status: She is alert and oriented to person, place, and time.      Assessment and Plan  1. Amyloid heart disease (*) (Primary) -continue to follow with cardiology -noted intermittent numbness of hands since starting medication and medication recently changed. If worsening can consider starting gabapentin 2. Chronic diastolic heart failure (*) -euvolemic, continue to follow with cardiology 3. Essential hypertension -well controlled, continue current medicaitons 4. Hyperlipidemia, unspecified hyperlipidemia type -     Comprehensive Metabolic Panel -     Lipid Panel 5. Secondary hyperparathyroidism (*) -     Comprehensive Metabolic Panel 6. Prediabetes -     Hemoglobin A1c 7. Stage 3b chronic kidney disease (*) -     Comprehensive Metabolic Panel 8. History of CVA (cerebrovascular accident) -     Lipid Panel 9. Vitamin D deficiency -     Vitamin D 25 hydroxy  Follow up in about 4 months (around 01/04/2025), or if symptoms worsen or fail to improve, for 30 min follow-up.    Patient's Medications       * Accurate as of September 05, 2024  9:56 AM. Reflects encounter med changes as of last refresh          Continued Medications      Instructions  albuterol  sulfate HFA 108 (90 Base) MCG/ACT inhaler Commonly known as: PROVENTIL ,VENTOLIN ,PROAIR   2 puffs, Every 6 hours as needed   allopurinol  300 mg tablet Commonly known as: ZYLOPRIM   300 mg, Oral, Every morning   amLODIPine  besylate 5 mg tablet Commonly known as: NORVASC   5 mg, Oral, Daily   AMVUTTRA 25 MG/0.5ML Sosy injection Generic drug: vutrisiran sodium  Provider to inject 1 syringe (25 mg dose) into the skin every 3 (three) months.   aspirin  81 mg chewable tablet  81 mg, Daily   bumetanide  2 mg tablet Commonly known as:  BUMEX   2 mg, Oral, 2 times a day   carvedilol  6.25 mg tablet Commonly known as: COREG   6.25 mg, Oral, 2 times a day   losartan  potassium 100 mg tablet Commonly known as: COZAAR   100 mg, Oral, Daily   potassium chloride  10 mEq crys ER tablet Commonly known as: KLOR-CON  M10  20 mEq, Oral, Daily   rosuvastatin  calcium  10 mg tablet Commonly known as: CRESTOR   10 mg, Oral, At bedtime   VITAMIN A PO  2,400 mcg, Daily   VYNDAMAX  61 MG Caps capsule Generic drug: tafamidis   61 mg, Oral, Daily       Modified Medications      Instructions  metolazone 2.5 mg tablet Commonly known as: ZAROXOLYN What changed:  when to take this reasons to take this  Take one tablet tomorrow 30 minutes before taking Bumex .          Risks, benefits, and alternatives of the medications and treatment plan prescribed today  were discussed, and patient expressed understanding. Plan follow-up as discussed or as needed if any worsening symptoms or change in condition.          [1] Patient Active Problem List Diagnosis   Hyperlipidemia   Essential hypertension   Leg swelling   Hypertensive cardiomyopathy- possible   Cataract, nuclear sclerotic, left eye   Cataract, nuclear sclerotic, right eye   PSVT (paroxysmal supraventricular tachycardia) (*)   Prediabetes   History of gout   Stage 3b chronic kidney disease (*)   History of CVA (cerebrovascular accident)   Enlarged pulmonary artery (*)   Primary osteoarthritis of both hips   Severe obesity (BMI 35.0-39.9) with comorbidity (*)   Left ventricular hypertrophy   Small airways disease   Anxiety   Chronic bilateral low back pain without sciatica   Chronic right shoulder pain   History of hepatitis C   Gastroesophageal reflux disease without esophagitis   Genital herpes   Iliotibial band syndrome   Osteopenia   Proteinuria   Venous insufficiency (chronic) (peripheral)   Vitamin D deficiency   Chronic  diastolic heart failure (*)   Amyloid heart disease (*)   Thyroid nodule   Secondary hyperparathyroidism (*)   Trochanteric bursitis of both hips  [2] Allergies Allergen Reactions   Lisinopril Other    Swelling   Swelling  Possible swelling   Clonidine Other    drowsiness   Triamcinolone Acetonide Rash  *Some images could not be shown."

## 2024-11-09 ENCOUNTER — Other Ambulatory Visit: Payer: Self-pay

## 2024-11-09 ENCOUNTER — Encounter (HOSPITAL_COMMUNITY): Payer: Self-pay

## 2024-11-09 ENCOUNTER — Emergency Department (HOSPITAL_COMMUNITY)

## 2024-11-09 ENCOUNTER — Inpatient Hospital Stay (HOSPITAL_COMMUNITY)
Admission: EM | Admit: 2024-11-09 | Discharge: 2024-11-12 | DRG: 193 | Disposition: A | Discharge status: Home / Self Care

## 2024-11-09 DIAGNOSIS — N1832 Chronic kidney disease, stage 3b: Secondary | ICD-10-CM | POA: Diagnosis present

## 2024-11-09 DIAGNOSIS — Z87891 Personal history of nicotine dependence: Secondary | ICD-10-CM | POA: Diagnosis not present

## 2024-11-09 DIAGNOSIS — I43 Cardiomyopathy in diseases classified elsewhere: Secondary | ICD-10-CM | POA: Diagnosis present

## 2024-11-09 DIAGNOSIS — Z7982 Long term (current) use of aspirin: Secondary | ICD-10-CM

## 2024-11-09 DIAGNOSIS — J102 Influenza due to other identified influenza virus with gastrointestinal manifestations: Secondary | ICD-10-CM | POA: Diagnosis present

## 2024-11-09 DIAGNOSIS — Z96652 Presence of left artificial knee joint: Secondary | ICD-10-CM | POA: Diagnosis present

## 2024-11-09 DIAGNOSIS — I5033 Acute on chronic diastolic (congestive) heart failure: Secondary | ICD-10-CM | POA: Diagnosis present

## 2024-11-09 DIAGNOSIS — Z809 Family history of malignant neoplasm, unspecified: Secondary | ICD-10-CM | POA: Diagnosis not present

## 2024-11-09 DIAGNOSIS — J45909 Unspecified asthma, uncomplicated: Secondary | ICD-10-CM | POA: Diagnosis present

## 2024-11-09 DIAGNOSIS — I471 Supraventricular tachycardia, unspecified: Secondary | ICD-10-CM | POA: Diagnosis present

## 2024-11-09 DIAGNOSIS — E876 Hypokalemia: Secondary | ICD-10-CM | POA: Diagnosis present

## 2024-11-09 DIAGNOSIS — E785 Hyperlipidemia, unspecified: Secondary | ICD-10-CM | POA: Diagnosis present

## 2024-11-09 DIAGNOSIS — I130 Hypertensive heart and chronic kidney disease with heart failure and stage 1 through stage 4 chronic kidney disease, or unspecified chronic kidney disease: Secondary | ICD-10-CM | POA: Diagnosis present

## 2024-11-09 DIAGNOSIS — Z801 Family history of malignant neoplasm of trachea, bronchus and lung: Secondary | ICD-10-CM | POA: Diagnosis not present

## 2024-11-09 DIAGNOSIS — M109 Gout, unspecified: Secondary | ICD-10-CM | POA: Diagnosis present

## 2024-11-09 DIAGNOSIS — Z79899 Other long term (current) drug therapy: Secondary | ICD-10-CM

## 2024-11-09 DIAGNOSIS — Z8673 Personal history of transient ischemic attack (TIA), and cerebral infarction without residual deficits: Secondary | ICD-10-CM

## 2024-11-09 DIAGNOSIS — E854 Organ-limited amyloidosis: Secondary | ICD-10-CM | POA: Diagnosis present

## 2024-11-09 DIAGNOSIS — Z860100 Personal history of colon polyps, unspecified: Secondary | ICD-10-CM | POA: Diagnosis not present

## 2024-11-09 DIAGNOSIS — J101 Influenza due to other identified influenza virus with other respiratory manifestations: Secondary | ICD-10-CM | POA: Diagnosis present

## 2024-11-09 DIAGNOSIS — R0902 Hypoxemia: Secondary | ICD-10-CM | POA: Diagnosis present

## 2024-11-09 DIAGNOSIS — Z8249 Family history of ischemic heart disease and other diseases of the circulatory system: Secondary | ICD-10-CM | POA: Diagnosis not present

## 2024-11-09 DIAGNOSIS — J111 Influenza due to unidentified influenza virus with other respiratory manifestations: Principal | ICD-10-CM

## 2024-11-09 DIAGNOSIS — Z888 Allergy status to other drugs, medicaments and biological substances status: Secondary | ICD-10-CM | POA: Diagnosis not present

## 2024-11-09 DIAGNOSIS — I509 Heart failure, unspecified: Secondary | ICD-10-CM

## 2024-11-09 LAB — BASIC METABOLIC PANEL WITH GFR
Anion gap: 10 (ref 5–15)
BUN: 18 mg/dL (ref 8–23)
CO2: 23 mmol/L (ref 22–32)
Calcium: 9 mg/dL (ref 8.9–10.3)
Chloride: 104 mmol/L (ref 98–111)
Creatinine, Ser: 1.2 mg/dL — ABNORMAL HIGH (ref 0.44–1.00)
GFR, Estimated: 46 mL/min — ABNORMAL LOW
Glucose, Bld: 97 mg/dL (ref 70–99)
Potassium: 3.4 mmol/L — ABNORMAL LOW (ref 3.5–5.1)
Sodium: 137 mmol/L (ref 135–145)

## 2024-11-09 LAB — CBC WITH DIFFERENTIAL/PLATELET
Abs Immature Granulocytes: 0.02 K/uL (ref 0.00–0.07)
Basophils Absolute: 0 K/uL (ref 0.0–0.1)
Basophils Relative: 0 %
Eosinophils Absolute: 0.1 K/uL (ref 0.0–0.5)
Eosinophils Relative: 1 %
HCT: 39.7 % (ref 36.0–46.0)
Hemoglobin: 13.5 g/dL (ref 12.0–15.0)
Immature Granulocytes: 0 %
Lymphocytes Relative: 9 %
Lymphs Abs: 0.6 K/uL — ABNORMAL LOW (ref 0.7–4.0)
MCH: 28.2 pg (ref 26.0–34.0)
MCHC: 34 g/dL (ref 30.0–36.0)
MCV: 82.9 fL (ref 80.0–100.0)
Monocytes Absolute: 0.6 K/uL (ref 0.1–1.0)
Monocytes Relative: 9 %
Neutro Abs: 5.5 K/uL (ref 1.7–7.7)
Neutrophils Relative %: 81 %
Platelets: 166 K/uL (ref 150–400)
RBC: 4.79 MIL/uL (ref 3.87–5.11)
RDW: 14.5 % (ref 11.5–15.5)
WBC: 6.8 K/uL (ref 4.0–10.5)
nRBC: 0 % (ref 0.0–0.2)

## 2024-11-09 LAB — PRO BRAIN NATRIURETIC PEPTIDE: Pro Brain Natriuretic Peptide: 11020 pg/mL — ABNORMAL HIGH

## 2024-11-09 MED ORDER — LOSARTAN POTASSIUM 50 MG PO TABS
100.0000 mg | ORAL_TABLET | Freq: Every day | ORAL | Status: DC
  Administered 2024-11-10 – 2024-11-12 (×3): 100 mg via ORAL
  Filled 2024-11-09 (×3): qty 2

## 2024-11-09 MED ORDER — ACETAMINOPHEN 650 MG RE SUPP: 650.0000 mg | Freq: Four times a day (QID) | RECTAL | Status: DC | PRN

## 2024-11-09 MED ORDER — OSELTAMIVIR PHOSPHATE 30 MG PO CAPS
30.0000 mg | ORAL_CAPSULE | Freq: Two times a day (BID) | ORAL | Status: DC
  Administered 2024-11-10 – 2024-11-12 (×5): 30 mg via ORAL
  Filled 2024-11-09 (×7): qty 1

## 2024-11-09 MED ORDER — ACETAMINOPHEN 500 MG PO TABS
1000.0000 mg | ORAL_TABLET | Freq: Once | ORAL | Status: AC
  Administered 2024-11-09: 1000 mg via ORAL
  Filled 2024-11-09: qty 2

## 2024-11-09 MED ORDER — ACETAMINOPHEN 325 MG PO TABS
650.0000 mg | ORAL_TABLET | Freq: Four times a day (QID) | ORAL | Status: DC | PRN
  Administered 2024-11-10 – 2024-11-11 (×4): 650 mg via ORAL
  Filled 2024-11-09 (×4): qty 2

## 2024-11-09 MED ORDER — FUROSEMIDE 10 MG/ML IJ SOLN: 40.0000 mg | Freq: Once | INTRAMUSCULAR | Status: DC

## 2024-11-09 MED ORDER — ASPIRIN 81 MG PO TBEC
81.0000 mg | DELAYED_RELEASE_TABLET | Freq: Every day | ORAL | Status: DC
  Administered 2024-11-10 – 2024-11-12 (×3): 81 mg via ORAL
  Filled 2024-11-09 (×3): qty 1

## 2024-11-09 MED ORDER — AMLODIPINE BESYLATE 5 MG PO TABS
5.0000 mg | ORAL_TABLET | Freq: Every day | ORAL | Status: DC
  Administered 2024-11-10 – 2024-11-12 (×3): 5 mg via ORAL
  Filled 2024-11-09 (×3): qty 1

## 2024-11-09 MED ORDER — ALLOPURINOL 300 MG PO TABS
300.0000 mg | ORAL_TABLET | Freq: Every day | ORAL | Status: DC
  Administered 2024-11-10 – 2024-11-12 (×3): 300 mg via ORAL
  Filled 2024-11-09: qty 3
  Filled 2024-11-09 (×2): qty 1

## 2024-11-09 MED ORDER — IPRATROPIUM-ALBUTEROL 0.5-2.5 (3) MG/3ML IN SOLN
3.0000 mL | Freq: Once | RESPIRATORY_TRACT | Status: AC
  Administered 2024-11-09: 3 mL via RESPIRATORY_TRACT
  Filled 2024-11-09: qty 3

## 2024-11-09 MED ORDER — OSELTAMIVIR PHOSPHATE 75 MG PO CAPS
75.0000 mg | ORAL_CAPSULE | Freq: Once | ORAL | Status: AC
  Administered 2024-11-09: 75 mg via ORAL
  Filled 2024-11-09: qty 1

## 2024-11-09 MED ORDER — CARVEDILOL 6.25 MG PO TABS
6.2500 mg | ORAL_TABLET | Freq: Two times a day (BID) | ORAL | Status: DC
  Administered 2024-11-10 – 2024-11-12 (×3): 6.25 mg via ORAL
  Filled 2024-11-09 (×2): qty 1
  Filled 2024-11-09: qty 2
  Filled 2024-11-09: qty 1

## 2024-11-09 MED ORDER — TAFAMIDIS 61 MG PO CAPS: 61.0000 mg | ORAL_CAPSULE | Freq: Every day | ORAL | Status: DC

## 2024-11-09 MED ORDER — POTASSIUM CHLORIDE CRYS ER 20 MEQ PO TBCR
40.0000 meq | EXTENDED_RELEASE_TABLET | Freq: Once | ORAL | Status: AC
  Administered 2024-11-10: 40 meq via ORAL
  Filled 2024-11-09: qty 2

## 2024-11-09 MED ORDER — ROSUVASTATIN CALCIUM 5 MG PO TABS
10.0000 mg | ORAL_TABLET | Freq: Every day | ORAL | Status: DC
  Administered 2024-11-10 – 2024-11-11 (×3): 10 mg via ORAL
  Filled 2024-11-09 (×3): qty 2

## 2024-11-09 MED ORDER — FUROSEMIDE 10 MG/ML IJ SOLN
60.0000 mg | Freq: Once | INTRAMUSCULAR | Status: AC
  Administered 2024-11-09: 60 mg via INTRAVENOUS
  Filled 2024-11-09: qty 6

## 2024-11-09 MED ORDER — ENOXAPARIN SODIUM 40 MG/0.4ML IJ SOSY
40.0000 mg | PREFILLED_SYRINGE | INTRAMUSCULAR | Status: DC
  Administered 2024-11-10 – 2024-11-12 (×3): 40 mg via SUBCUTANEOUS
  Filled 2024-11-09 (×3): qty 0.4

## 2024-11-09 NOTE — ED Provider Triage Note (Signed)
 Emergency Medicine Provider Triage Evaluation Note  Julia Tucker , a 79 y.o. female  was evaluated in triage.  Pt complains of ***.  Review of Systems  Positive: *** Negative: ***  Physical Exam  BP 135/69 (BP Location: Right Arm)   Pulse 72   Temp 98.4 F (36.9 C)   Resp 20   Ht 5' 7 (1.702 m)   Wt 104.8 kg   SpO2 97%   BMI 36.18 kg/m  Gen:   Awake, no distress  *** Resp:  Normal effort *** MSK:   Moves extremities without difficulty *** Other:  ***  Medical Decision Making  Medically screening exam initiated at 1:14 PM.  Appropriate orders placed.  Julia Tucker was informed that the remainder of the evaluation will be completed by another provider, this initial triage assessment does not replace that evaluation, and the importance of remaining in the ED until their evaluation is complete.  ***

## 2024-11-09 NOTE — Progress Notes (Signed)
 Subjective   Patient ID:  Julia Tucker is a 79 y.o. (DOB 08-22-1946) female.   Chief Complaint  Patient presents with   Cough    Pt presents today with c/o cough vomiting, body aces x 2 days      79 year old female coming in with 1 day of runny nose, congestion, cough, generalized bodyaches and fatigue, chills and fevers, nausea and Multiple episodes of nonbloody/nonbilious emesis.  She denies any abdominal pain, pelvic pain, flank or back pain, urinary symptoms, vaginal symptoms, diarrhea, trouble swallowing or voice changes, chest pain/pressure/palpitation/tightness, dizziness/lightheadedness, shortness of breath, lower extremity swelling, p.o. intolerance, recent travel or sick contacts, or any other complaints.  Has not been vaccinated for influenza this season.  Cough Associated symptoms include chills, myalgias, postnasal drip and rhinorrhea. Pertinent negatives include no chest pain, ear pain, eye redness, fever, headaches, rash, sore throat, shortness of breath or wheezing.     Review of Systems Review of Systems  Constitutional:  Positive for chills and fatigue. Negative for activity change, appetite change, diaphoresis, fever and unexpected weight change.  HENT:  Positive for congestion, postnasal drip and rhinorrhea. Negative for dental problem, drooling, ear discharge, ear pain, facial swelling, hearing loss, mouth sores, nosebleeds, sinus pressure, sinus pain, sneezing, sore throat, tinnitus, trouble swallowing and voice change.   Eyes: Negative.  Negative for photophobia, pain, discharge, redness, itching and visual disturbance.  Respiratory:  Positive for cough. Negative for apnea, choking, chest tightness, shortness of breath, wheezing and stridor.   Cardiovascular: Negative.  Negative for chest pain, palpitations and leg swelling.  Gastrointestinal:  Positive for nausea and vomiting. Negative for abdominal distention, abdominal pain, anal bleeding, blood in stool,  constipation, diarrhea and rectal pain.  Endocrine: Negative.  Negative for cold intolerance, heat intolerance, polydipsia, polyphagia and polyuria.  Genitourinary: Negative.  Negative for decreased urine volume, difficulty urinating, dyspareunia, dysuria, enuresis, flank pain, frequency, genital sores, hematuria, menstrual problem, pelvic pain, urgency, vaginal bleeding, vaginal discharge and vaginal pain.  Musculoskeletal:  Positive for myalgias. Negative for arthralgias, back pain, joint swelling, neck pain and neck stiffness.  Skin: Negative.  Negative for color change, pallor, rash and wound.  Neurological: Negative.  Negative for dizziness, tremors, seizures, syncope, facial asymmetry, speech difficulty, weakness, light-headedness, numbness and headaches.  Hematological: Negative.  Negative for adenopathy. Does not bruise/bleed easily.  Psychiatric/Behavioral: Negative.  Negative for confusion, decreased concentration, dysphoric mood and hallucinations. The patient is not nervous/anxious and is not hyperactive.     Objective   BP 145/74   Pulse 68   Temp (!) 103.1 F (39.5 C) (Tympanic)   Resp 16   Ht 5' 8 (1.727 m)   Wt 231 lb (104.8 kg)   SpO2 91%   BMI 35.12 kg/m   Physical Exam Vitals and nursing note reviewed. Exam conducted with a chaperone present.  Constitutional:      General: She is awake. She is not in acute distress.    Appearance: Normal appearance. She is well-developed, well-groomed and normal weight. She is not ill-appearing, toxic-appearing or diaphoretic.  HENT:     Head: Normocephalic and atraumatic. No right periorbital erythema or left periorbital erythema.     Jaw: There is normal jaw occlusion. No trismus, tenderness, swelling, pain on movement or malocclusion.     Salivary Glands: Right salivary gland is not diffusely enlarged or tender. Left salivary gland is not diffusely enlarged or tender.     Right Ear: Hearing, tympanic membrane, ear canal and  external ear  normal. No decreased hearing noted. No laceration, drainage, swelling or tenderness. No middle ear effusion. There is no impacted cerumen. No foreign body. No mastoid tenderness. No PE tube. No hemotympanum. Tympanic membrane is not injected, scarred, perforated, erythematous, retracted or bulging.     Left Ear: Hearing, tympanic membrane, ear canal and external ear normal. No decreased hearing noted. No laceration, drainage, swelling or tenderness.  No middle ear effusion. There is no impacted cerumen. No foreign body. No mastoid tenderness. No PE tube. No hemotympanum. Tympanic membrane is not injected, scarred, perforated, erythematous, retracted or bulging.     Nose: Congestion and rhinorrhea present. No nasal deformity, septal deviation, signs of injury, laceration, nasal tenderness or mucosal edema. Rhinorrhea is clear.     Right Nostril: No foreign body, epistaxis, septal hematoma or occlusion.     Left Nostril: No foreign body, epistaxis, septal hematoma or occlusion.     Right Turbinates: Not enlarged, swollen or pale.     Left Turbinates: Not enlarged, swollen or pale.     Right Sinus: No maxillary sinus tenderness or frontal sinus tenderness.     Left Sinus: No maxillary sinus tenderness or frontal sinus tenderness.     Mouth/Throat:     Lips: Pink. No lesions.     Mouth: Mucous membranes are moist. No injury, lacerations, oral lesions or angioedema.     Dentition: Normal dentition. Does not have dentures. No dental tenderness, gingival swelling, dental caries, dental abscesses or gum lesions.     Tongue: No lesions. Tongue does not deviate from midline.     Palate: No mass and lesions.     Pharynx: Oropharynx is clear. Uvula midline. No pharyngeal swelling, oropharyngeal exudate, posterior oropharyngeal erythema or uvula swelling.     Tonsils: No tonsillar exudate or tonsillar abscesses.  Eyes:     General: Lids are normal. Vision grossly intact. Gaze aligned appropriately.  No scleral icterus.       Right eye: No foreign body, discharge or hordeolum.        Left eye: No foreign body, discharge or hordeolum.     Extraocular Movements: Extraocular movements intact.     Right eye: Normal extraocular motion and no nystagmus.     Left eye: Normal extraocular motion and no nystagmus.     Conjunctiva/sclera: Conjunctivae normal.     Right eye: Right conjunctiva is not injected. No chemosis, exudate or hemorrhage.    Left eye: Left conjunctiva is not injected. No chemosis, exudate or hemorrhage.    Pupils: Pupils are equal, round, and reactive to light. Pupils are equal.     Right eye: Pupil is round, reactive and not sluggish.     Left eye: Pupil is round, reactive and not sluggish.  Neck:     Thyroid: No thyroid mass, thyromegaly or thyroid tenderness.     Vascular: Normal carotid pulses. No carotid bruit, hepatojugular reflux or JVD.     Trachea: Trachea and phonation normal. No tracheal tenderness, tracheostomy or tracheal deviation.  Cardiovascular:     Rate and Rhythm: Normal rate and regular rhythm. No extrasystoles are present.    Chest Wall: PMI is not displaced. No thrill.     Pulses: Normal pulses. No decreased pulses.          Radial pulses are 2+ on the right side and 2+ on the left side.       Dorsalis pedis pulses are 2+ on the right side and 2+ on the left side.  Posterior tibial pulses are 2+ on the right side and 2+ on the left side.     Heart sounds: S1 normal and S2 normal. Heart sounds not distant. Murmur heard.     Systolic murmur is present with a grade of 1/6.     No diastolic murmur is present.     No friction rub. No gallop. No S3 or S4 sounds.  Musculoskeletal:        General: Normal range of motion.     Cervical back: Full passive range of motion without pain, normal range of motion and neck supple. No edema, erythema, signs of trauma, rigidity, torticollis, tenderness or crepitus. No pain with movement, spinous process tenderness  or muscular tenderness. Normal range of motion.     Right lower leg: Edema present.     Left lower leg: Edema present.  Pulmonary:     Effort: Pulmonary effort is normal. No tachypnea, bradypnea, accessory muscle usage, prolonged expiration, respiratory distress or retractions.     Breath sounds: Normal breath sounds and air entry. No stridor, decreased air movement or transmitted upper airway sounds. No decreased breath sounds, wheezing, rhonchi or rales.  Chest:     Chest wall: No tenderness.  Abdominal:     General: Abdomen is flat. Bowel sounds are normal. There is no distension or abdominal bruit. There are no signs of injury.     Palpations: Abdomen is soft. There is no shifting dullness, fluid wave, hepatomegaly, splenomegaly, mass or pulsatile mass.     Tenderness: There is no abdominal tenderness. There is no right CVA tenderness, left CVA tenderness, guarding or rebound. Negative signs include Murphy's sign, Rovsing's sign, McBurney's sign, psoas sign and obturator sign.     Hernia: No hernia is present.  Lymphadenopathy:     Head:     Right side of head: No submental, submandibular, tonsillar, preauricular, posterior auricular or occipital adenopathy.     Left side of head: No submental, submandibular, tonsillar, preauricular, posterior auricular or occipital adenopathy.     Cervical: No cervical adenopathy.     Right cervical: No superficial, deep or posterior cervical adenopathy.    Left cervical: No superficial, deep or posterior cervical adenopathy.     Upper Body:     Right upper body: No supraclavicular adenopathy.     Left upper body: No supraclavicular adenopathy.     Lower Body: No right inguinal adenopathy. No left inguinal adenopathy.  Skin:    General: Skin is warm.     Capillary Refill: Capillary refill takes less than 2 seconds.     Coloration: Skin is not ashen, cyanotic, jaundiced, mottled, pale or sallow.     Findings: No abrasion, abscess, acne, bruising,  burn, ecchymosis, erythema, signs of injury, laceration, lesion, petechiae, rash or wound. Rash is not crusting, macular, nodular, papular, purpuric, pustular, scaling, urticarial or vesicular.     Nails: There is no clubbing.  Neurological:     General: No focal deficit present.     Mental Status: She is alert and oriented to person, place, and time.     Cranial Nerves: No cranial nerve deficit.     Sensory: No sensory deficit.     Motor: Motor function is intact. No weakness.     Coordination: Coordination normal.     Gait: Gait is intact. Gait normal.     Deep Tendon Reflexes: Reflexes are normal and symmetric. Reflexes normal.     Reflex Scores:      Patellar reflexes  are 2+ on the right side and 2+ on the left side. Psychiatric:        Attention and Perception: Attention and perception normal.        Mood and Affect: Mood and affect normal.        Speech: Speech normal.        Behavior: Behavior normal. Behavior is cooperative.        Thought Content: Thought content normal.        Cognition and Memory: Cognition and memory normal.        Judgment: Judgment normal.      Medical Decision Making   Problems Addressed:  1. Flu  oseltamivir  phosphate (TAMIFLU ) 30 mg capsule   oseltamivir  phosphate (TAMIFLU ) 75 mg capsule   ondansetron  (ZOFRAN ) 4 mg tablet    2. Viral syndrome  POCT Flu Nucleic Acid   ondansetron  (ZOFRAN ) injection 4 mg         Data Reviewed and Analyzed:    Encounter orders Orders Placed This Encounter  Procedures   POCT Flu Nucleic Acid     Lab results if available Recent Results (from the past 12 hours)  POCT Flu Nucleic Acid   Collection Time: 11/09/24 11:27 AM  Result Value Ref Range   Influenza A/B A Positive (A) Negative    Imaging results if available No results found.    Patient Management:  Patient is well-appearing but does look fatigued.  She is in no acute distress.  Her SpO2 on room air is 91% and has ranged as high as 93  and as low as 89 with ambulation.  She is febrile 103.5 Fahrenheit.  She was given acetaminophen , Zofran  IM.  She remains on room air at SpO2 of 93% with no distress or tachypnea.  Discussed this with patient and given her past medical history particularly of CHF combined with her acute symptoms of positive influenza on point-of-care testing today instructed patient that she should be further evaluated and managed in the emergency department to which she agreed.  She was instructed that she should go by ambulance but ultimately declined and reports that she will have her granddaughter drive her to [facility] ED now.  Risk of self transport discussed with patient and granddaughter and expressed understanding.  Risks, benefits, and alternatives of the medications (OTC and or prescribed) and treatment plan prescribed today were discussed, and patient expressed understanding. Plan follow-up as discussed and as needed if they developed any new or worsening signs/symptoms or change in condition in anything they deem necessary for further evaluation. Discussed with patient their conditions and management of them as well as any prescription/OTC medications regarding this visit. Discussed with patient Red Flag signs/symptoms necessitating the need to seek immediate medical attention at their nearest Emergency Department or by calling 911. The patient was informed that they would be contacted with results of any tests including lab work or imaging whether normal or abnormal.  If they have not received these results within 2 to 3 days of the visit or procedure they need to contact us  or their primary care doctor to make sure these results are relayed to them. They were also informed to retrieve their results via MyChart if active and d/w patient navigation of MyChart. They expressed understanding and their questions were answered.   Medications Administered, Prescribed or Modified    Patient's Medications        * Accurate as of November 09, 2024 12:11 PM. Reflects encounter med changes as  of last refresh          New Prescriptions      Instructions  ondansetron  4 mg tablet Commonly known as: ZOFRAN  Started by: Dustin Pressley, FNP  4 mg, Oral, Every 8 hours as needed   * oseltamivir  phosphate 30 mg capsule Commonly known as: TAMIFLU  Started by: Dustin Pressley, FNP  30 mg, Oral, 2 times a day   * oseltamivir  phosphate 75 mg capsule Commonly known as: TAMIFLU  Started by: Dustin Pressley, FNP  75 mg, Oral, Daily      * * This list has 2 medication(s) that are the same as other medications prescribed for you. Read the directions carefully, and ask your doctor or other care provider to review them with you.          Continued Medications      Instructions  albuterol  sulfate HFA 108 (90 Base) MCG/ACT inhaler Commonly known as: PROVENTIL ,VENTOLIN ,PROAIR   2 puffs, Every 6 hours as needed   allopurinol  300 mg tablet Commonly known as: ZYLOPRIM   300 mg, Oral, Every morning   amLODIPine  besylate 5 mg tablet Commonly known as: NORVASC   5 mg, Oral, Daily   AMVUTTRA 25 MG/0.5ML Sosy injection Generic drug: vutrisiran sodium  Provider to inject 1 syringe (25 mg dose) into the skin every 3 (three) months.   aspirin  81 mg chewable tablet  81 mg, Daily   bumetanide  2 mg tablet Commonly known as: BUMEX   2 mg, Oral, 2 times a day   carvedilol  6.25 mg tablet Commonly known as: COREG   6.25 mg, Oral, 2 times a day   losartan  potassium 100 mg tablet Commonly known as: COZAAR   100 mg, Oral, Daily   methocarbamol  500 mg tablet Commonly known as: ROBAXIN   500 mg, Oral, 3 times a day   potassium chloride  10 mEq crys ER tablet Commonly known as: KLOR-CON  M10  20 mEq, Oral, Daily   rosuvastatin  calcium  10 mg tablet Commonly known as: CRESTOR   10 mg, Oral, At bedtime   VITAMIN A PO  2,400 mcg, Daily   VYNDAMAX  61 MG Caps capsule Generic drug: tafamidis   61 mg, Oral,  Daily       Modified Medications      Instructions  metolazone 2.5 mg tablet Commonly known as: ZAROXOLYN What changed:  when to take this reasons to take this  Take one tablet tomorrow 30 minutes before taking Bumex .        Patient or parent/guardian verbalized understanding regarding plan and followup.  Follow up for Go to the ED if worse.   The following instructions were provided and discussed with the patient (or parent/guardian) as part of her care plan. Patient Instructions  Go directly to the nearest emergency department immediately.    *Some images could not be shown.

## 2024-11-09 NOTE — ED Notes (Signed)
 Patient placed on 2 L [place] due to oxygen saturations between 89% - 93% on room air

## 2024-11-09 NOTE — H&P (Incomplete)
 " History and Physical    [name] FMW:[id number] DOB: [birth year] DOA: 11/09/2024  PCP: Default, Provider, MD  Patient coming from: Home  Chief Complaint: ***  HPI: Julia Tucker is a 79 y.o. female with medical history significant of hypertension, hyperlipidemia, asthma, cardiac amyloidosis/HFpEF, history of persistent right pleural effusion, history of chronic left leg swelling/venous insufficiency, paroxysmal SVT, gout, CKD stage IIIa, CVA, osteoarthritis, hepatitis C, prediabetes, thyroid nodule, vitamin D deficiency.  Patient was seen at [facility] urgent care today for flulike symptoms and tested positive for influenza A.  Patient noted to be febrile with temperature 103.5 F and oxygen saturation was as low as 89% on room air with ambulation so she was sent to the ED for further evaluation.  She was given Tylenol  and Zofran  prior to arrival.  In the ED, oxygen saturation noted to be 89-93% on room air and was placed on 2 L [place].  Febrile with temperature 104 F.  Not tachycardic or hypotensive.  No significant abnormalities on CBC.  BMP notable for potassium 3.4, creatinine 1.2 (stable), proBNP 11,020.  Chest x-ray showing mildly increased right basilar opacity concerning for possible increased atelectasis or scarring with possible small right effusion.  Patient was given Tylenol , IV Lasix  60 mg, DuoNeb, and Tamiflu  in the ED.  TRH called to admit.  History limited as patient is somnolent.  She is reporting 2-day history of cough, malaise, generalized weakness, vomiting, and non-bloody diarrhea.  She normally takes Bumex  2 mg twice daily but has not been able to take any of her home medications for the past 2 days due to feeling ill.  Denies shortness of breath, chest pain, or abdominal pain.  She does not use home oxygen.  Does not smoke cigarettes.  Review of Systems:  Review of Systems  All other systems reviewed and are negative.   Past Medical History:  Diagnosis Date   Arthritis     Hepatitis    Hx: of Hep C   Hypertension    Kidney stones    Hx: of   Pneumonia    Shortness of breath    Hx: of with exertion   Tachycardia    Hx: of PSVT - Dr. Elspeth Kitten and Dr. Alm Needle [facility])    Past Surgical History:  Procedure Laterality Date   ABDOMINAL HYSTERECTOMY     APPENDECTOMY     BREAST SURGERY     COLONOSCOPY W/ BIOPSIES AND POLYPECTOMY     Hx; of   CYST EXCISION     Hx: of on chin   FRACTURE SURGERY     plate and 12 screws in Right ankle   JOINT REPLACEMENT     KNEE ARTHROPLASTY Left 07/17/2013   Procedure: COMPUTER ASSISTED TOTAL KNEE ARTHROPLASTY;  Surgeon: Oneil JAYSON Herald, MD;  Location: [facility] OR;  Service: Orthopedics;  Laterality: Left;  Left Total Knee Arthroplasty, Cemented, Computer Assist   TONSILLECTOMY       reports that she has quit smoking. She has never used smokeless tobacco. She reports that she does not drink alcohol and does not use drugs.  Allergies[1]  Family History  Problem Relation Age of Onset   Hypertension Mother    Cancer - Lung Father    Cancer - Other Sister     Prior to Admission medications  Medication Sig Start Date End Date Taking? Authorizing Provider  allopurinol  (ZYLOPRIM ) 300 MG tablet Take 300 mg by mouth daily.   Yes [provider]  amLODipine  (  NORVASC ) 2.5 MG tablet Take 1 tablet (2.5 mg total) by mouth daily. Patient taking differently: Take 5 mg by mouth daily. 12/29/22  Yes Odell Celinda Balo, MD  aspirin  EC 81 MG tablet Take 81 mg by mouth daily. Swallow whole.   Yes [provider]  bumetanide  (BUMEX ) 2 MG tablet Take 2 mg by mouth 2 (two) times daily. 09/04/24  Yes [provider]  carvedilol  (COREG ) 6.25 MG tablet Take 6.25 mg by mouth 2 (two) times daily with a meal. 08/13/24  Yes [provider]  losartan  (COZAAR ) 100 MG tablet Take 100 mg by mouth daily.   Yes [provider]  methocarbamol  (ROBAXIN ) 500 MG tablet Take 500  mg by mouth daily as needed for muscle spasms. 10/18/24  Yes [provider]  potassium chloride  (KLOR-CON ) 10 MEQ tablet Take 20 mEq by mouth daily.   Yes [provider]  rosuvastatin  (CRESTOR ) 10 MG tablet Take 10 mg by mouth at bedtime. 08/13/24  Yes [provider]  Tafamidis  61 MG CAPS Take 61 mg by mouth daily. 12/12/23  Yes [provider]  VITAMIN A PO Take 2,400 mcg by mouth daily.   Yes [provider]    Physical Exam: Vitals:   11/09/24 2200 11/09/24 2215 11/09/24 2230 11/09/24 2245  BP: 125/66 129/69 123/66 114/62  Pulse: 72 76 69 70  Resp:    16  Temp:      TempSrc:      SpO2: 99% 98% 99% 99%  Weight:      Height:        Physical Exam Vitals reviewed.  Constitutional:      General: She is not in acute distress. HENT:     Head: Normocephalic and atraumatic.  Eyes:     Extraocular Movements: Extraocular movements intact.  Cardiovascular:     Rate and Rhythm: Normal rate and regular rhythm.     Heart sounds: Normal heart sounds.  Pulmonary:     Effort: Pulmonary effort is normal. No respiratory distress.     Breath sounds: Rales present. No wheezing.  Abdominal:     General: Bowel sounds are normal. There is no distension.     Palpations: Abdomen is soft.     Tenderness: There is no abdominal tenderness. There is no guarding.  Musculoskeletal:     Cervical back: Normal range of motion.     Comments: Mild bilateral pedal edema  Skin:    General: Skin is warm and dry.  Neurological:     General: No focal deficit present.     Mental Status: She is alert and oriented to person, place, and time.     Cranial Nerves: No cranial nerve deficit.     Sensory: No sensory deficit.     Motor: No weakness.     Labs on Admission: I have personally reviewed following labs and imaging studies  CBC: Recent Labs  Lab 11/09/24 1329  WBC 6.8  NEUTROABS 5.5  HGB 13.5  HCT 39.7  MCV 82.9  PLT 166   Basic Metabolic  Panel: Recent Labs  Lab 11/09/24 1329  NA 137  K 3.4*  CL 104  CO2 23  GLUCOSE 97  BUN 18  CREATININE 1.20*  CALCIUM  9.0   GFR: Estimated Creatinine Clearance: 48.9 mL/min (A) (by C-G formula based on SCr of 1.2 mg/dL (H)). Liver Function Tests: No results for input(s): AST, ALT, ALKPHOS, BILITOT, PROT, ALBUMIN in the last 168 hours. No results for input(s): LIPASE,  AMYLASE in the last 168 hours. No results for input(s): AMMONIA in the last 168 hours. Coagulation Profile: No results for input(s): INR, PROTIME in the last 168 hours. Cardiac Enzymes: No results for input(s): CKTOTAL, CKMB, CKMBINDEX, TROPONINI in the last 168 hours. BNP (last 3 results) Recent Labs    11/09/24 1329  PROBNP 11,020.0*   HbA1C: No results for input(s): HGBA1C in the last 72 hours. CBG: No results for input(s): GLUCAP in the last 168 hours. Lipid Profile: No results for input(s): CHOL, HDL, LDLCALC, TRIG, CHOLHDL, LDLDIRECT in the last 72 hours. Thyroid Function Tests: No results for input(s): TSH, T4TOTAL, FREET4, T3FREE, THYROIDAB in the last 72 hours. Anemia Panel: No results for input(s): VITAMINB12, FOLATE, FERRITIN, TIBC, IRON, RETICCTPCT in the last 72 hours. Urine analysis:    Component Value Date/Time   COLORURINE STRAW (A) 12/28/2022 0001   APPEARANCEUR CLEAR 12/28/2022 0001   LABSPEC 1.021 12/28/2022 0001   PHURINE 6.0 12/28/2022 0001   GLUCOSEU NEGATIVE 12/28/2022 0001   HGBUR SMALL (A) 12/28/2022 0001   BILIRUBINUR NEGATIVE 12/28/2022 0001   KETONESUR 5 (A) 12/28/2022 0001   PROTEINUR NEGATIVE 12/28/2022 0001   UROBILINOGEN 1.0 07/11/2013 1010   NITRITE NEGATIVE 12/28/2022 0001   LEUKOCYTESUR NEGATIVE 12/28/2022 0001    Radiological Exams on Admission: DG Chest 2 View Result Date: 11/09/2024 CLINICAL DATA:  Cough, fever EXAM: CHEST - 2 VIEW COMPARISON:  August 12, 2012 FINDINGS: Stable cardiomegaly.  Stable left perihilar scarring. Mildly increased right basilar opacity is noted concerning for possible increased atelectasis or scarring with possible small right pleural effusion. Bony thorax is unremarkable. IMPRESSION: Mildly increased right basilar opacity is noted concerning for possible increased atelectasis or scarring with possible small right pleural effusion. Electronically Signed   By: Lynwood Landy Raddle M.D.   On: 11/09/2024 15:00    Assessment and Plan  Influenza A infection Mild hypoxia Patient is reporting 2-day history of cough and malaise.  Tested positive for influenza A at urgent care today.  Febrile but does not meet any other SIRS criteria at this time.  Oxygen saturation 89% on room air with ambulation and currently stable on 2 L [place].  Chest x-ray showing mildly increased right basilar opacity concerning for possible increased atelectasis or scarring with possible small right effusion.  No leukocytosis on labs.  Continue 5-day course of Tamiflu .  Continue supplemental oxygen, wean as tolerated.  Droplet and contact precautions.  Acute on chronic HFpEF Cardiac amyloidosis proBNP 11,020.  Chest x-ray showing possible small right effusion but no pulmonary edema.  Last echo done in February 2025 showing EF 70 to 75%, moderate diastolic dysfunction, severe concentric LV hypertrophy, and mild to moderate mitral regurgitation.  Patient has not taken Bumex  for the past 2 days in setting of acute viral illness.  Patient was given IV Lasix  60 mg in the ED.  Repeat echocardiogram ordered.  Monitor intake and output, daily weights.  Dietary sodium and fluid restriction.  Vomiting and non-bloody diarrhea  Mild hypokalemia Likely due to poor p.o. intake.  Monitor potassium and magnesium levels, continue to replace as needed.  Hypertension Stable, currently normotensive.  Hyperlipidemia  Asthma  History of persistent right pleural effusion History of chronic left leg swelling/venous  insufficiency  Paroxysmal SVT  Gout  CKD stage IIIa Creatinine stable.  History of CVA      DVT prophylaxis: Lovenox  Code Status: Full Code (discussed with the patient) Family Communication: ***  Consults called: ***  Level of care: {Blank  single:19197::Med-Surg,Telemetry bed,Progressive Care Unit,Step Down Unit} Admission status: *** Time Spent: 75+ minutes***  Editha Ram MD [facility]  If 7PM-7AM, please contact night-coverage [email or web address]  11/09/2024, 11:00 PM         [1] Allergies Allergen Reactions   Clonidine     Drowsiness    Triamcinolone Acetonide Rash   Prinivil [Lisinopril] Swelling  "

## 2024-11-09 NOTE — H&P (Signed)
 " History and Physical    NIL BOLSER FMW:[id number] DOB: 1945/11/11 DOA: 11/09/2024  PCP: Default, Provider, MD  Patient coming from: Home  Chief Complaint: Cough, malaise  HPI: Julia Tucker is a 79 y.o. female with medical history significant of hypertension, hyperlipidemia, asthma, cardiac amyloidosis/HFpEF, history of persistent right pleural effusion, history of chronic left leg swelling/venous insufficiency, paroxysmal SVT, gout, CKD stage IIIa, CVA, osteoarthritis, hepatitis C, prediabetes, thyroid nodule, vitamin D deficiency.  Patient was seen at [facility] urgent care today for flulike symptoms and tested positive for influenza A.  Patient noted to be febrile with temperature 103.5 F and oxygen saturation was as low as 89% on room air with ambulation so she was sent to the ED for further evaluation.  She was given Tylenol  and Zofran  prior to arrival.  In the ED, oxygen saturation noted to be 89-93% on room air and was placed on 2 L [place].  Febrile with temperature 104 F.  Not tachycardic or hypotensive.  No significant abnormalities on CBC.  BMP notable for potassium 3.4, creatinine 1.2 (stable), proBNP 11,020.  Chest x-ray showing mildly increased right basilar opacity concerning for possible increased atelectasis or scarring with possible small right effusion.  Patient was given Tylenol , IV Lasix  60 mg, DuoNeb, and Tamiflu  in the ED.  TRH called to admit.  History limited as patient is somnolent.  She is reporting 2-day history of cough, malaise, generalized weakness, vomiting, and non-bloody diarrhea.  She normally takes Bumex  2 mg twice daily but has not been able to take any of her home medications for the past 2 days due to feeling ill.  Denies shortness of breath, chest pain, or abdominal pain.  She does not use home oxygen.  Does not smoke cigarettes.  Review of Systems:  Review of Systems  All other systems reviewed and are negative.   Past Medical History:  Diagnosis Date    Arthritis    Hepatitis    Hx: of Hep C   Hypertension    Kidney stones    Hx: of   Pneumonia    Shortness of breath    Hx: of with exertion   Tachycardia    Hx: of PSVT - Dr. Elspeth Kitten and Dr. Alm Needle [facility])    Past Surgical History:  Procedure Laterality Date   ABDOMINAL HYSTERECTOMY     APPENDECTOMY     BREAST SURGERY     COLONOSCOPY W/ BIOPSIES AND POLYPECTOMY     Hx; of   CYST EXCISION     Hx: of on chin   FRACTURE SURGERY     plate and 12 screws in Right ankle   JOINT REPLACEMENT     KNEE ARTHROPLASTY Left 07/17/2013   Procedure: COMPUTER ASSISTED TOTAL KNEE ARTHROPLASTY;  Surgeon: Oneil JAYSON Herald, MD;  Location: [facility] OR;  Service: Orthopedics;  Laterality: Left;  Left Total Knee Arthroplasty, Cemented, Computer Assist   TONSILLECTOMY       reports that she has quit smoking. She has never used smokeless tobacco. She reports that she does not drink alcohol and does not use drugs.  Allergies[1]  Family History  Problem Relation Age of Onset   Hypertension Mother    Cancer - Lung Father    Cancer - Other Sister     Prior to Admission medications  Medication Sig Start Date End Date Taking? Authorizing Provider  allopurinol  (ZYLOPRIM ) 300 MG tablet Take 300 mg by mouth daily.   Yes [provider]  amLODipine  (NORVASC ) 2.5 MG tablet Take 1 tablet (2.5 mg total) by mouth daily. Patient taking differently: Take 5 mg by mouth daily. 12/29/22  Yes Odell Celinda Balo, MD  aspirin  EC 81 MG tablet Take 81 mg by mouth daily. Swallow whole.   Yes [provider]  bumetanide  (BUMEX ) 2 MG tablet Take 2 mg by mouth 2 (two) times daily. 09/04/24  Yes [provider]  carvedilol  (COREG ) 6.25 MG tablet Take 6.25 mg by mouth 2 (two) times daily with a meal. 08/13/24  Yes [provider]  losartan  (COZAAR ) 100 MG tablet Take 100 mg by mouth daily.   Yes [provider]  methocarbamol  (ROBAXIN ) 500 MG tablet Take 500 mg by  mouth daily as needed for muscle spasms. 10/18/24  Yes [provider]  potassium chloride  (KLOR-CON ) 10 MEQ tablet Take 20 mEq by mouth daily.   Yes [provider]  rosuvastatin  (CRESTOR ) 10 MG tablet Take 10 mg by mouth at bedtime. 08/13/24  Yes [provider]  Tafamidis  61 MG CAPS Take 61 mg by mouth daily. 12/12/23  Yes [provider]  VITAMIN A PO Take 2,400 mcg by mouth daily.   Yes [provider]    Physical Exam: Vitals:   11/09/24 2200 11/09/24 2215 11/09/24 2230 11/09/24 2245  BP: 125/66 129/69 123/66 114/62  Pulse: 72 76 69 70  Resp:    16  Temp:      TempSrc:      SpO2: 99% 98% 99% 99%  Weight:      Height:        Physical Exam Vitals reviewed.  Constitutional:      General: She is not in acute distress. HENT:     Head: Normocephalic and atraumatic.  Eyes:     Extraocular Movements: Extraocular movements intact.  Cardiovascular:     Rate and Rhythm: Normal rate and regular rhythm.     Heart sounds: Normal heart sounds.  Pulmonary:     Effort: Pulmonary effort is normal. No respiratory distress.     Breath sounds: Rales present. No wheezing.  Abdominal:     General: Bowel sounds are normal. There is no distension.     Palpations: Abdomen is soft.     Tenderness: There is no abdominal tenderness. There is no guarding.  Musculoskeletal:     Cervical back: Normal range of motion.     Comments: Mild bilateral pedal edema  Skin:    General: Skin is warm and dry.  Neurological:     General: No focal deficit present.     Mental Status: She is alert and oriented to person, place, and time.     Cranial Nerves: No cranial nerve deficit.     Sensory: No sensory deficit.     Motor: No weakness.     Labs on Admission: I have personally reviewed following labs and imaging studies  CBC: Recent Labs  Lab 11/09/24 1329  WBC 6.8  NEUTROABS 5.5  HGB 13.5  HCT 39.7  MCV 82.9  PLT 166   Basic Metabolic Panel: Recent  Labs  Lab 11/09/24 1329  NA 137  K 3.4*  CL 104  CO2 23  GLUCOSE 97  BUN 18  CREATININE 1.20*  CALCIUM  9.0   GFR: Estimated Creatinine Clearance: 48.9 mL/min (A) (by C-G formula based on SCr of 1.2 mg/dL (H)). Liver Function Tests: No results for input(s): AST, ALT, ALKPHOS, BILITOT, PROT, ALBUMIN in the last 168 hours. No results for input(s):  LIPASE, AMYLASE in the last 168 hours. No results for input(s): AMMONIA in the last 168 hours. Coagulation Profile: No results for input(s): INR, PROTIME in the last 168 hours. Cardiac Enzymes: No results for input(s): CKTOTAL, CKMB, CKMBINDEX, TROPONINI in the last 168 hours. BNP (last 3 results) Recent Labs    11/09/24 1329  PROBNP 11,020.0*   HbA1C: No results for input(s): HGBA1C in the last 72 hours. CBG: No results for input(s): GLUCAP in the last 168 hours. Lipid Profile: No results for input(s): CHOL, HDL, LDLCALC, TRIG, CHOLHDL, LDLDIRECT in the last 72 hours. Thyroid Function Tests: No results for input(s): TSH, T4TOTAL, FREET4, T3FREE, THYROIDAB in the last 72 hours. Anemia Panel: No results for input(s): VITAMINB12, FOLATE, FERRITIN, TIBC, IRON, RETICCTPCT in the last 72 hours. Urine analysis:    Component Value Date/Time   COLORURINE STRAW (A) 12/28/2022 0001   APPEARANCEUR CLEAR 12/28/2022 0001   LABSPEC 1.021 12/28/2022 0001   PHURINE 6.0 12/28/2022 0001   GLUCOSEU NEGATIVE 12/28/2022 0001   HGBUR SMALL (A) 12/28/2022 0001   BILIRUBINUR NEGATIVE 12/28/2022 0001   KETONESUR 5 (A) 12/28/2022 0001   PROTEINUR NEGATIVE 12/28/2022 0001   UROBILINOGEN 1.0 07/11/2013 1010   NITRITE NEGATIVE 12/28/2022 0001   LEUKOCYTESUR NEGATIVE 12/28/2022 0001    Radiological Exams on Admission: DG Chest 2 View Result Date: 11/09/2024 CLINICAL DATA:  Cough, fever EXAM: CHEST - 2 VIEW COMPARISON:  August 12, 2012 FINDINGS: Stable cardiomegaly. Stable left  perihilar scarring. Mildly increased right basilar opacity is noted concerning for possible increased atelectasis or scarring with possible small right pleural effusion. Bony thorax is unremarkable. IMPRESSION: Mildly increased right basilar opacity is noted concerning for possible increased atelectasis or scarring with possible small right pleural effusion. Electronically Signed   By: Lynwood Landy Raddle M.D.   On: 11/09/2024 15:00    Assessment and Plan  Influenza A infection Mild hypoxia Patient is reporting 2-day history of cough and malaise.  Tested positive for influenza A at urgent care today.  Febrile but does not meet any other SIRS criteria at this time.  Oxygen saturation 89% on room air with ambulation and currently stable on 2 L [place].  Chest x-ray showing mildly increased right basilar opacity concerning for possible increased atelectasis or scarring with possible small right effusion.  No leukocytosis on labs.  Continue 5-day course of Tamiflu .  Continue supplemental oxygen, wean as tolerated.  Droplet and contact precautions.  Acute on chronic HFpEF Cardiac amyloidosis proBNP 11,020.  Chest x-ray showing possible small right effusion but no pulmonary edema.  Last echo done in February 2025 showing EF 70 to 75%, moderate diastolic dysfunction, severe concentric LV hypertrophy, and mild to moderate mitral regurgitation.  Patient has not taken Bumex  for the past 2 days in setting of acute viral illness.  She was given IV Lasix  60 mg in the ED.  Repeat echocardiogram ordered.  Monitor intake and output, daily weights, renal function.  Dietary sodium and fluid restriction.  Continue home Coreg , losartan , and tafamidis .  Emesis Likely due to influenza infection.  No abdominal pain or tenderness.  No leukocytosis on labs.  Not actively vomiting at this time.  Check lipase and LFTs.  Non-bloody diarrhea Likely due to influenza infection.  No leukocytosis on labs.  No recent antibiotic use reported.   Continue symptomatic management.  Mild hypokalemia Monitor potassium and magnesium levels, continue to replace as needed.  Hypertension Stable, currently normotensive.  Continue amlodipine , Coreg , and losartan .  Hyperlipidemia Continue Crestor .  Asthma Stable, no wheezing.  Does not use inhalers at home.  Paroxysmal SVT Continue Coreg .  Gout Continue allopurinol .  CKD stage IIIa Creatinine stable, monitor labs.  History of CVA Continue aspirin  and Crestor .  DVT prophylaxis: Lovenox  Code Status: Full Code (discussed with the patient) Level of care: Telemetry bed Admission status: It is my clinical opinion that admission to INPATIENT is reasonable and necessary because of the expectation that this patient will require hospital care that crosses at least 2 midnights to treat this condition based on the medical complexity of the problems presented.  Given the aforementioned information, the predictability of an adverse outcome is felt to be significant.  Editha Ram MD [facility]  If 7PM-7AM, please contact night-coverage [email or web address]  11/09/2024, 11:00 PM       [1]  Allergies Allergen Reactions   Clonidine     Drowsiness    Triamcinolone Acetonide Rash   Prinivil [Lisinopril] Swelling   "

## 2024-11-09 NOTE — ED Triage Notes (Signed)
 She was given tylenol  and zofran  at urgent care before coming to ED. Oxygen was 91% on RA on arrival to ED.   Hx of CHF

## 2024-11-09 NOTE — ED Provider Notes (Signed)
 " [place] EMERGENCY DEPARTMENT AT [facility] Provider Note   CSN: [id number] Arrival date & time: 11/09/24  1241     Patient presents with: Shortness of Breath   Julia Tucker is a 79 y.o. female.   79 year old female with past medical history of CHF, hypertension, and hyperlipidemia presenting to the emergency department today with cough, myalgias, and shortness of breath.  Patient states she has been feeling unwell now for the past 2 days.  She denies any associated nausea or vomiting.  States that she does have some leg swelling normally at baseline.  She is currently on Bumex  twice daily.  Has been taking this as prescribed except for this morning as she was feeling unwell and went to urgent care instead.  She is found to be hypoxic with ambulation urgent care so she was sent to the ER for further evaluation.  She denies any associated chest pain.   Shortness of Breath      Prior to Admission medications  Medication Sig Start Date End Date Taking? Authorizing Provider  allopurinol  (ZYLOPRIM ) 300 MG tablet Take 300 mg by mouth daily.   Yes [provider]  amLODipine  (NORVASC ) 2.5 MG tablet Take 1 tablet (2.5 mg total) by mouth daily. Patient taking differently: Take 5 mg by mouth daily. 12/29/22  Yes Odell Celinda Balo, MD  aspirin  EC 81 MG tablet Take 81 mg by mouth daily. Swallow whole.   Yes [provider]  bumetanide  (BUMEX ) 2 MG tablet Take 2 mg by mouth 2 (two) times daily. 09/04/24  Yes [provider]  carvedilol  (COREG ) 6.25 MG tablet Take 6.25 mg by mouth 2 (two) times daily with a meal. 08/13/24  Yes [provider]  losartan  (COZAAR ) 100 MG tablet Take 100 mg by mouth daily.   Yes [provider]  methocarbamol  (ROBAXIN ) 500 MG tablet Take 500 mg by mouth daily as needed for muscle spasms. 10/18/24  Yes [provider]  potassium chloride  (KLOR-CON ) 10 MEQ tablet Take 20 mEq by mouth daily.   Yes  [provider]  rosuvastatin  (CRESTOR ) 10 MG tablet Take 10 mg by mouth at bedtime. 08/13/24  Yes [provider]  Tafamidis  61 MG CAPS Take 61 mg by mouth daily. 12/12/23  Yes [provider]  VITAMIN A PO Take 2,400 mcg by mouth daily.   Yes [provider]    Allergies: Clonidine, Triamcinolone acetonide, and Prinivil [lisinopril]    Review of Systems  Respiratory:  Positive for shortness of breath.   Cardiovascular:  Positive for leg swelling.  All other systems reviewed and are negative.   Updated Vital Signs BP 117/63 (BP Location: Right Arm)   Pulse 67   Temp (!) 100.8 F (38.2 C) (Oral)   Resp 16   Ht 5' 7 (1.702 m)   Wt 104.8 kg   SpO2 100%   BMI 36.18 kg/m   Physical Exam Vitals and nursing note reviewed.   Gen: NAD, mild conversational dyspnea noted Eyes: PERRL, EOMI HEENT: no oropharyngeal swelling Neck: trachea midline Resp: Diminished at bilateral lung bases Card: RRR, no murmurs, rubs, or gallops Abd: nontender, nondistended Extremities: no calf tenderness, 1+ pitting edema bilaterally Vascular: 2+ radial pulses bilaterally, 2+ DP pulses bilaterally Neuro: No focal deficits Skin: no rashes Psyc: acting appropriately   (all labs ordered are listed, but only abnormal results are displayed) Labs Reviewed  CBC WITH DIFFERENTIAL/PLATELET - Abnormal; Notable for the following components:  Result Value   Lymphs Abs 0.6 (*)    All other components within normal limits  BASIC METABOLIC PANEL WITH GFR - Abnormal; Notable for the following components:   Potassium 3.4 (*)    Creatinine, Ser 1.20 (*)    GFR, Estimated 46 (*)    All other components within normal limits  PRO BRAIN NATRIURETIC PEPTIDE - Abnormal; Notable for the following components:   Pro Brain Natriuretic Peptide 11,020.0 (*)    All other components within normal limits    EKG: None  Radiology: DG Chest 2 View Result Date: 11/09/2024 CLINICAL  DATA:  Cough, fever EXAM: CHEST - 2 VIEW COMPARISON:  August 12, 2012 FINDINGS: Stable cardiomegaly. Stable left perihilar scarring. Mildly increased right basilar opacity is noted concerning for possible increased atelectasis or scarring with possible small right pleural effusion. Bony thorax is unremarkable. IMPRESSION: Mildly increased right basilar opacity is noted concerning for possible increased atelectasis or scarring with possible small right pleural effusion. Electronically Signed   By: Lynwood Landy Raddle M.D.   On: 11/09/2024 15:00     Procedures   Medications Ordered in the ED  acetaminophen  (TYLENOL ) tablet 1,000 mg (1,000 mg Oral Given 11/09/24 2008)  ipratropium-albuterol  (DUONEB) 0.5-2.5 (3) MG/3ML nebulizer solution 3 mL (3 mLs Nebulization Given 11/09/24 2008)  furosemide  (LASIX ) injection 60 mg (60 mg Intravenous Given 11/09/24 2010)  oseltamivir  (TAMIFLU ) capsule 75 mg (75 mg Oral Given 11/09/24 2008)                                    Medical Decision Making 79 year old female with past medical history of CHF, hypertension, and hyperlipidemia presenting to the emergency department today flulike symptoms and shortness of breath.  The patient does have some lower extremity swelling here as well.  Will further evaluate the patient here with basic labs as well as a BNP and chest x-ray to evaluate for pulmonary edema, pulmonary infiltrates, pneumothorax.  I give the patient Tylenol  for her fever/headache.  Will give her a dose of Lasix  as well as a DuoNeb.  After the above medications will obtain an ambulatory pulse ox to evaluate the need for admission.  Will give the patient Tamiflu  as well after her renal function is determined.  The patient's BNP is elevated.  X-ray does show some small pleural effusions.  Patient is given Lasix .  She also given a DuoNeb.  Her pulse ox is in the low 90s.  We did try to ambulate the patient for an ambulatory pulse ox she was too generally weak to get up.   A call was placed to the hospitalist service for admission for diuresis and further supportive management for the flu as well as CHF exacerbation.  Risk OTC drugs. Prescription drug management. Decision regarding hospitalization.        Final diagnoses:  Influenza  Acute on chronic congestive heart failure, unspecified heart failure type [facility])    ED Discharge Orders     None          Ula Prentice SAUNDERS, MD 11/09/24 2316  "

## 2024-11-09 NOTE — ED Triage Notes (Signed)
 Patient was sent from [facility] for eval of low 02 levels , states she tested positive for Flu

## 2024-11-10 DIAGNOSIS — J101 Influenza due to other identified influenza virus with other respiratory manifestations: Secondary | ICD-10-CM | POA: Diagnosis not present

## 2024-11-10 LAB — BASIC METABOLIC PANEL WITH GFR
Anion gap: 9 (ref 5–15)
BUN: 21 mg/dL (ref 8–23)
CO2: 25 mmol/L (ref 22–32)
Calcium: 8.5 mg/dL — ABNORMAL LOW (ref 8.9–10.3)
Chloride: 106 mmol/L (ref 98–111)
Creatinine, Ser: 1.49 mg/dL — ABNORMAL HIGH (ref 0.44–1.00)
GFR, Estimated: 36 mL/min — ABNORMAL LOW
Glucose, Bld: 91 mg/dL (ref 70–99)
Potassium: 3.3 mmol/L — ABNORMAL LOW (ref 3.5–5.1)
Sodium: 140 mmol/L (ref 135–145)

## 2024-11-10 LAB — HEPATIC FUNCTION PANEL
ALT: 14 U/L (ref 0–44)
AST: 38 U/L (ref 15–41)
Albumin: 3.3 g/dL — ABNORMAL LOW (ref 3.5–5.0)
Alkaline Phosphatase: 135 U/L — ABNORMAL HIGH (ref 38–126)
Bilirubin, Direct: 0.3 mg/dL — ABNORMAL HIGH (ref 0.0–0.2)
Indirect Bilirubin: 0.5 mg/dL (ref 0.3–0.9)
Total Bilirubin: 0.8 mg/dL (ref 0.0–1.2)
Total Protein: 6.8 g/dL (ref 6.5–8.1)

## 2024-11-10 LAB — MAGNESIUM: Magnesium: 2 mg/dL (ref 1.7–2.4)

## 2024-11-10 LAB — CREATININE, SERUM
Creatinine, Ser: 1.37 mg/dL — ABNORMAL HIGH (ref 0.44–1.00)
GFR, Estimated: 40 mL/min — ABNORMAL LOW

## 2024-11-10 LAB — LIPASE, BLOOD: Lipase: 13 U/L (ref 11–51)

## 2024-11-10 MED ORDER — BUMETANIDE 2 MG PO TABS
2.0000 mg | ORAL_TABLET | Freq: Two times a day (BID) | ORAL | Status: DC
  Administered 2024-11-10 – 2024-11-12 (×5): 2 mg via ORAL
  Filled 2024-11-10 (×6): qty 1

## 2024-11-10 MED ORDER — TAFAMIDIS 61 MG PO CAPS
61.0000 mg | ORAL_CAPSULE | Freq: Every day | ORAL | Status: DC
  Administered 2024-11-11 – 2024-11-12 (×2): 61 mg via ORAL
  Filled 2024-11-10 (×5): qty 1

## 2024-11-10 MED ORDER — POTASSIUM CHLORIDE CRYS ER 20 MEQ PO TBCR
40.0000 meq | EXTENDED_RELEASE_TABLET | Freq: Once | ORAL | Status: AC
  Administered 2024-11-10: 40 meq via ORAL
  Filled 2024-11-10: qty 2

## 2024-11-10 NOTE — ED Notes (Signed)
 Patient questioned dose and look of the carvedilol . Stated she doesn't take that much at home and does not look like the pill at home. Patient called family member to discuss information at home and then took medication. Patient given one saltine cracker with medication and sips of water.

## 2024-11-10 NOTE — Progress Notes (Addendum)
 " PROGRESS NOTE    Julia Tucker  FMW:[id number] DOB: 1945/12/30 DOA: 11/09/2024 PCP: Default, Provider, MD    Brief Narrative:  Patient is a 79 year old female with PMHx of HFpEF, infiltrative cardiomyopathy with cardiac amyloid, paroxysmal SVT, CKD stage IIIb, gout, CVA, hepatitis C, HTN, HLD, persistent right pleural effusion, chronic venous insufficiency, who presented to the the ED on 11/09/2024 from [facility] urgent care for flulike symptoms, positive influenza A, and hypoxia. The patient was experiencing a couple days of URI symptoms with myalgias, fatigue, fever, chills, and multiple episodes of NBNB emesis.   At urgent care, patient was noted to be febrile to 103.5 F, with SpO2 91-93% on RA with drop to 89% with ambulation, and positive for influenza A.  The patient was given Tylenol  and Zofran  prior to arrival to the ED.  In the ED SpO2 was noted to be 89 to 93% on RA and the patient was placed on 2 L [place].  She was febrile to 104 F.  No significant abnormalities on CBC.  BMP notable for K3.4, creatinine 1.2.  proBNP elevated to 11,000.  CXR showed mildly increased right basilar opacity concerning for increased atelectasis or scarring with possible small right pleural effusion.  She was treated with Tylenol , IV Lasix  60 mg, DuoNeb, and Tamiflu  in the ED.  She was admitted for influenza A infection with concern for acute on chronic HFpEF.  Assessment and Plan:  # Influenza A infection # Mild hypoxia - Presented with 2 days of URI symptoms, malaise, fevers, N/V. Positive for influenza A at urgent care - CXR without concern for concomitant pneumonia - Was on 2L [place] at 100% SpO2. Weaned down to RA, maintained SpOw 99-100% - Continues to be febrile - Continue Tamiflu  for 5 days - Supportive care  # Acute on chronic HFpEF  # Infiltrative cardiomyopathy with cardiac amyloidosis - Follows at outpatient HF clinic - Reports compliance with all medications except the day of her presentation  - Pro  BNP elevated to 11,000 but patient not overtly volume overload on exam - Received IV Lasix  60 mg in the ED - Echo pending - Strict I&Os, daily weights - Continue home Coreg  6.25 mg BID, Losartan  100 mg daily, Tafamidis  61 mg daily - Resume home Bumex  2 mg BID  # CKD stage IIIb - Cr 1.20 on admission. Review of outside records shows recent Cr 1.29 - 1.58 - Cr up to 1.49 today, within recent baseline creatinine - Continue to monitor  # HTN - Continue home amlodipine , losartan , Coreg   #Gout - Continue home allopurinol  300 mg daily  # CVA - Continue ASA and Crestor   # HLD - Continue Crestor   Scheduled Meds:  allopurinol   300 mg Oral Daily   amLODipine   5 mg Oral Daily   aspirin  EC  81 mg Oral Daily   carvedilol   6.25 mg Oral BID WC   enoxaparin  (LOVENOX ) injection  40 mg Subcutaneous Q24H   losartan   100 mg Oral Daily   oseltamivir   30 mg Oral BID   rosuvastatin   10 mg Oral QHS   Tafamidis   61 mg Oral Daily   Continuous Infusions: PRN Meds:.acetaminophen  **OR** acetaminophen   Current Outpatient Medications  Medication Instructions   allopurinol  (ZYLOPRIM ) 300 mg, Daily   amLODipine  (NORVASC ) 2.5 mg, Oral, Daily   aspirin  EC 81 mg, Daily   bumetanide  (BUMEX ) 2 mg, Oral, 2 times daily   carvedilol  (COREG ) 6.25 mg, Oral, 2 times daily with meals   losartan  (COZAAR )  100 mg, Daily   methocarbamol  (ROBAXIN ) 500 mg, Oral, Daily PRN   potassium chloride  (KLOR-CON ) 10 MEQ tablet 20 mEq, Oral, Daily   rosuvastatin  (CRESTOR ) 10 mg, Oral, Daily at bedtime   Tafamidis  61 mg, Oral, Daily   VITAMIN A PO 2,400 mcg, Oral, Daily    DVT prophylaxis: enoxaparin  (LOVENOX ) injection 40 mg Start: 11/10/24 1000   Code Status:   Code Status: Full Code  Family Communication: None  Disposition Plan: Home pending clinical improvement PT -   -   OT -   -    Level of care: Telemetry  Consultants:  None  Procedures:  None   Subjective: No acute events overnight.  Patient  reports feeling better today.  Was on 2 L [place] on evaluation with SpO2 100%.  Weaned down to room air while in the room.  Maintained SpO2 99 to 100%.  Tolerated fluids without any nausea or vomiting but has not had any food yet.  Would like to continue to be monitored through tomorrow.  Objective: Vitals:   11/10/24 0515 11/10/24 0715 11/10/24 0820 11/10/24 0830  BP: 133/88   117/71  Pulse: 67 65  60  Resp: 18   17  Temp:   (!) 100.4 F (38 C)   TempSrc:      SpO2: 100% 100%  100%  Weight:      Height:       No intake or output data in the 24 hours ending 11/10/24 0908 Filed Weights   11/09/24 1253  Weight: 104.8 kg    Examination:  Gen: NAD, A&Ox3 HEENT: NCAT Neck: Supple, no JVD CV: RRR, no murmurs Resp: normal WOB, mild right basilar rales, otherwise clear to auscultation Abd: Soft, NTND, no guarding Ext: Trace LE edema to mid shin, pulses 2+ b/l Skin: Warm, dry Neuro: No focal deficits Psych: Calm, cooperative, appropriate affect    Data Reviewed: I have personally reviewed following labs and imaging studies  CBC: Recent Labs  Lab 11/09/24 1329  WBC 6.8  NEUTROABS 5.5  HGB 13.5  HCT 39.7  MCV 82.9  PLT 166   Basic Metabolic Panel: Recent Labs  Lab 11/09/24 1329 11/10/24 0052  NA 137 140  K 3.4* 3.3*  CL 104 106  CO2 23 25  GLUCOSE 97 91  BUN 18 21  CREATININE 1.20* 1.49*  CALCIUM  9.0 8.5*  MG  --  2.0   GFR: Estimated Creatinine Clearance: 39.4 mL/min (A) (by C-G formula based on SCr of 1.49 mg/dL (H)). Liver Function Tests: Recent Labs  Lab 11/10/24 0052  AST 38  ALT 14  ALKPHOS 135*  BILITOT 0.8  PROT 6.8  ALBUMIN 3.3*   Recent Labs  Lab 11/10/24 0052  LIPASE 13   No results for input(s): AMMONIA in the last 168 hours. Coagulation Profile: No results for input(s): INR, PROTIME in the last 168 hours. Cardiac Enzymes: No results for input(s): CKTOTAL, CKMB, CKMBINDEX, TROPONINI in the last 168 hours. BNP (last 3  results) Recent Labs    11/09/24 1329  PROBNP 11,020.0*   HbA1C: No results for input(s): HGBA1C in the last 72 hours. CBG: No results for input(s): GLUCAP in the last 168 hours. Lipid Profile: No results for input(s): CHOL, HDL, LDLCALC, TRIG, CHOLHDL, LDLDIRECT in the last 72 hours. Thyroid Function Tests: No results for input(s): TSH, T4TOTAL, FREET4, T3FREE, THYROIDAB in the last 72 hours. Anemia Panel: No results for input(s): VITAMINB12, FOLATE, FERRITIN, TIBC, IRON, RETICCTPCT in the last 72 hours.  Sepsis Labs: No results for input(s): PROCALCITON, LATICACIDVEN in the last 168 hours.  No results found for this or any previous visit (from the past 240 hours).   Radiology Studies: DG Chest 2 View Result Date: 11/09/2024 CLINICAL DATA:  Cough, fever EXAM: CHEST - 2 VIEW COMPARISON:  August 12, 2012 FINDINGS: Stable cardiomegaly. Stable left perihilar scarring. Mildly increased right basilar opacity is noted concerning for possible increased atelectasis or scarring with possible small right pleural effusion. Bony thorax is unremarkable. IMPRESSION: Mildly increased right basilar opacity is noted concerning for possible increased atelectasis or scarring with possible small right pleural effusion. Electronically Signed   By: Lynwood Landy Raddle M.D.   On: 11/09/2024 15:00    Scheduled Meds:  allopurinol   300 mg Oral Daily   amLODipine   5 mg Oral Daily   aspirin  EC  81 mg Oral Daily   carvedilol   6.25 mg Oral BID WC   enoxaparin  (LOVENOX ) injection  40 mg Subcutaneous Q24H   losartan   100 mg Oral Daily   oseltamivir   30 mg Oral BID   rosuvastatin   10 mg Oral QHS   Tafamidis   61 mg Oral Daily   Continuous Infusions:   Unresulted Labs (From admission, onward)     Start     Ordered   11/11/24 0500  CBC  Tomorrow morning,   R        11/10/24 0945   11/11/24 0500  Basic metabolic panel with GFR  Tomorrow morning,   R        11/10/24 0945    11/10/24 1500  Creatinine, serum  Once-Timed,   TIMED        11/10/24 0937             LOS:  LOS: 1 day   Time Spent: 45 minutes  Elkin Belfield Al-Sultani, MD [facility]  If 7PM-7AM, please contact night-coverage  11/10/2024, 9:08 AM      "

## 2024-11-10 NOTE — ED Notes (Signed)
 Patient reports she had her glasses on when she came in and can not be located at this time.

## 2024-11-10 NOTE — ED Notes (Signed)
 Pt bedding changed, pt provided with crackers and drink

## 2024-11-11 ENCOUNTER — Inpatient Hospital Stay (HOSPITAL_COMMUNITY)

## 2024-11-11 DIAGNOSIS — J101 Influenza due to other identified influenza virus with other respiratory manifestations: Secondary | ICD-10-CM | POA: Diagnosis not present

## 2024-11-11 DIAGNOSIS — I5033 Acute on chronic diastolic (congestive) heart failure: Secondary | ICD-10-CM

## 2024-11-11 LAB — CBC
HCT: 37.6 % (ref 36.0–46.0)
Hemoglobin: 12.9 g/dL (ref 12.0–15.0)
MCH: 28.4 pg (ref 26.0–34.0)
MCHC: 34.3 g/dL (ref 30.0–36.0)
MCV: 82.6 fL (ref 80.0–100.0)
Platelets: 126 K/uL — ABNORMAL LOW (ref 150–400)
RBC: 4.55 MIL/uL (ref 3.87–5.11)
RDW: 14.5 % (ref 11.5–15.5)
WBC: 2.9 K/uL — ABNORMAL LOW (ref 4.0–10.5)
nRBC: 0 % (ref 0.0–0.2)

## 2024-11-11 LAB — BASIC METABOLIC PANEL WITH GFR
Anion gap: 9 (ref 5–15)
BUN: 26 mg/dL — ABNORMAL HIGH (ref 8–23)
CO2: 26 mmol/L (ref 22–32)
Calcium: 8.3 mg/dL — ABNORMAL LOW (ref 8.9–10.3)
Chloride: 105 mmol/L (ref 98–111)
Creatinine, Ser: 1.41 mg/dL — ABNORMAL HIGH (ref 0.44–1.00)
GFR, Estimated: 38 mL/min — ABNORMAL LOW
Glucose, Bld: 79 mg/dL (ref 70–99)
Potassium: 3.7 mmol/L (ref 3.5–5.1)
Sodium: 139 mmol/L (ref 135–145)

## 2024-11-11 LAB — ECHOCARDIOGRAM COMPLETE
Area-P 1/2: 3.27 cm2
Height: 67 in
S' Lateral: 2.4 cm
Weight: 3622.6 [oz_av]

## 2024-11-11 MED ORDER — PERFLUTREN LIPID MICROSPHERE
1.0000 mL | INTRAVENOUS | Status: AC | PRN
  Administered 2024-11-11: 3 mL via INTRAVENOUS

## 2024-11-11 NOTE — Progress Notes (Signed)
 " PROGRESS NOTE    Julia Tucker  FMW:[id number] DOB: 1946-08-21 DOA: 11/09/2024 PCP: Default, Provider, MD    Brief Narrative:  Patient is a 79 year old female with PMHx of HFpEF, infiltrative cardiomyopathy with cardiac amyloid, paroxysmal SVT, CKD stage IIIb, gout, CVA, hepatitis C, HTN, HLD, persistent right pleural effusion, chronic venous insufficiency, who presented to the the ED on 11/09/2024 from [facility] urgent care for flulike symptoms, positive influenza A, and hypoxia. The patient was experiencing a couple days of URI symptoms with myalgias, fatigue, fever, chills, and multiple episodes of NBNB emesis.   At urgent care, patient was noted to be febrile to 103.5 F, with SpO2 91-93% on RA with drop to 89% with ambulation, and positive for influenza A.  The patient was given Tylenol  and Zofran  prior to arrival to the ED.  In the ED SpO2 was noted to be 89 to 93% on RA and the patient was placed on 2 L [place].  She was febrile to 104 F.  No significant abnormalities on CBC.  BMP notable for K3.4, creatinine 1.2.  proBNP elevated to 11,000.  CXR showed mildly increased right basilar opacity concerning for increased atelectasis or scarring with possible small right pleural effusion.  She was treated with Tylenol , IV Lasix  60 mg, DuoNeb, and Tamiflu  in the ED.  She was admitted for influenza A infection with concern for acute on chronic HFpEF.  Assessment and Plan:  # Influenza A infection # Mild hypoxia - Presented with 2 days of URI symptoms, malaise, fevers, N/V. Positive for influenza A at urgent care - CXR without concern for concomitant pneumonia - Maintained SpO2 > 90 % on RA - Afebrile overnight  - Continue Tamiflu  for 5 days - Supportive care  # Acute on chronic HFpEF  # Infiltrative cardiomyopathy with cardiac amyloidosis - Follows at outpatient HF clinic - Reports compliance with all medications except the day of her presentation  - Pro BNP elevated to 11,000 but patient not overtly  volume overload on exam - Received IV Lasix  60 mg in the ED - Echo showed LVEF 60-65%, no RWMA, severe concentric LVH, G2DD, flattened septum c/w volume overload, mildly reduced RV function, normal PASP, moderate biatrial dilation, small circumferential pericardial effusion without tamponade   - Strict I&Os, daily weights - Continue home Coreg  6.25 mg BID, Losartan  100 mg daily, Tafamidis  61 mg daily - Continue home Bumex  2 mg BID  # CKD stage IIIb - Cr 1.20 on admission. Review of outside records shows recent Cr 1.29 - 1.58 - Cr up to 1.41 today, within recent baseline creatinine - Continue to monitor  # HTN - Continue home amlodipine , losartan , Coreg   #Gout - Continue home allopurinol  300 mg daily  # CVA - Continue ASA and Crestor   # HLD - Continue Crestor   Scheduled Meds:  allopurinol   300 mg Oral Daily   amLODipine   5 mg Oral Daily   aspirin  EC  81 mg Oral Daily   bumetanide   2 mg Oral BID   carvedilol   6.25 mg Oral BID WC   enoxaparin  (LOVENOX ) injection  40 mg Subcutaneous Q24H   losartan   100 mg Oral Daily   oseltamivir   30 mg Oral BID   rosuvastatin   10 mg Oral QHS   Tafamidis   61 mg Oral Daily   Continuous Infusions: PRN Meds:.acetaminophen  **OR** acetaminophen   Current Outpatient Medications  Medication Instructions   allopurinol  (ZYLOPRIM ) 300 mg, Daily   amLODipine  (NORVASC ) 2.5 mg, Oral, Daily   aspirin   EC 81 mg, Daily   bumetanide  (BUMEX ) 2 mg, Oral, 2 times daily   carvedilol  (COREG ) 6.25 mg, Oral, 2 times daily with meals   losartan  (COZAAR ) 100 mg, Daily   methocarbamol  (ROBAXIN ) 500 mg, Oral, Daily PRN   potassium chloride  (KLOR-CON ) 10 MEQ tablet 20 mEq, Oral, Daily   rosuvastatin  (CRESTOR ) 10 mg, Oral, Daily at bedtime   Tafamidis  61 mg, Oral, Daily   VITAMIN A PO 2,400 mcg, Oral, Daily    DVT prophylaxis: enoxaparin  (LOVENOX ) injection 40 mg Start: 11/10/24 1000   Code Status:   Code Status: Full Code  Family Communication:  None  Disposition Plan: Home pending clinical improvement PT -   -   OT -   -    Level of care: Telemetry  Consultants:  None  Procedures:  None   Subjective: No acute events overnight.  Patient reports feeling better today though still sore with cough and malaise. Would like to be monitored for another day.  Objective: Vitals:   11/10/24 1630 11/10/24 2011 11/11/24 0500 11/11/24 0532  BP: 119/73 114/73  119/72  Pulse: 65 61  (!) 53  Resp: 19 18  18   Temp: 99.2 F (37.3 C) 99 F (37.2 C)  98.5 F (36.9 C)  TempSrc:  Oral  Oral  SpO2: 91% 95%  98%  Weight:   102.7 kg   Height:        Intake/Output Summary (Last 24 hours) at 11/11/2024 0653 Last data filed at 11/10/2024 1730 Gross per 24 hour  Intake 480 ml  Output 0 ml  Net 480 ml   Filed Weights   11/09/24 1253 11/11/24 0500  Weight: 104.8 kg 102.7 kg    Examination:  Gen: NAD, A&Ox3 HEENT: NCAT Neck: Supple, no JVD CV: RRR, no murmurs Resp: normal WOB, mild right basilar rales, otherwise clear to auscultation Abd: Soft, NTND, no guarding Ext: Trace LE edema to mid shin, pulses 2+ b/l Skin: Warm, dry Neuro: No focal deficits Psych: Calm, cooperative, appropriate affect    Data Reviewed: I have personally reviewed following labs and imaging studies  CBC: Recent Labs  Lab 11/09/24 1329 11/11/24 0413  WBC 6.8 2.9*  NEUTROABS 5.5  --   HGB 13.5 12.9  HCT 39.7 37.6  MCV 82.9 82.6  PLT 166 126*   Basic Metabolic Panel: Recent Labs  Lab 11/09/24 1329 11/10/24 0052 11/10/24 1506 11/11/24 0413  NA 137 140  --  139  K 3.4* 3.3*  --  3.7  CL 104 106  --  105  CO2 23 25  --  26  GLUCOSE 97 91  --  79  BUN 18 21  --  26*  CREATININE 1.20* 1.49* 1.37* 1.41*  CALCIUM  9.0 8.5*  --  8.3*  MG  --  2.0  --   --    GFR: Estimated Creatinine Clearance: 41.1 mL/min (A) (by C-G formula based on SCr of 1.41 mg/dL (H)). Liver Function Tests: Recent Labs  Lab 11/10/24 0052  AST 38  ALT 14  ALKPHOS  135*  BILITOT 0.8  PROT 6.8  ALBUMIN 3.3*   Recent Labs  Lab 11/10/24 0052  LIPASE 13   No results for input(s): AMMONIA in the last 168 hours. Coagulation Profile: No results for input(s): INR, PROTIME in the last 168 hours. Cardiac Enzymes: No results for input(s): CKTOTAL, CKMB, CKMBINDEX, TROPONINI in the last 168 hours. BNP (last 3 results) Recent Labs    11/09/24 1329  PROBNP 11,020.0*  HbA1C: No results for input(s): HGBA1C in the last 72 hours. CBG: No results for input(s): GLUCAP in the last 168 hours. Lipid Profile: No results for input(s): CHOL, HDL, LDLCALC, TRIG, CHOLHDL, LDLDIRECT in the last 72 hours. Thyroid Function Tests: No results for input(s): TSH, T4TOTAL, FREET4, T3FREE, THYROIDAB in the last 72 hours. Anemia Panel: No results for input(s): VITAMINB12, FOLATE, FERRITIN, TIBC, IRON, RETICCTPCT in the last 72 hours. Sepsis Labs: No results for input(s): PROCALCITON, LATICACIDVEN in the last 168 hours.  No results found for this or any previous visit (from the past 240 hours).   Radiology Studies: DG Chest 2 View Result Date: 11/09/2024 CLINICAL DATA:  Cough, fever EXAM: CHEST - 2 VIEW COMPARISON:  August 12, 2012 FINDINGS: Stable cardiomegaly. Stable left perihilar scarring. Mildly increased right basilar opacity is noted concerning for possible increased atelectasis or scarring with possible small right pleural effusion. Bony thorax is unremarkable. IMPRESSION: Mildly increased right basilar opacity is noted concerning for possible increased atelectasis or scarring with possible small right pleural effusion. Electronically Signed   By: Lynwood Landy Raddle M.D.   On: 11/09/2024 15:00    Scheduled Meds:  allopurinol   300 mg Oral Daily   amLODipine   5 mg Oral Daily   aspirin  EC  81 mg Oral Daily   bumetanide   2 mg Oral BID   carvedilol   6.25 mg Oral BID WC   enoxaparin  (LOVENOX ) injection  40 mg  Subcutaneous Q24H   losartan   100 mg Oral Daily   oseltamivir   30 mg Oral BID   rosuvastatin   10 mg Oral QHS   Tafamidis   61 mg Oral Daily   Continuous Infusions:   Unresulted Labs (From admission, onward)    None        LOS:  LOS: 2 days   Time Spent: 45 minutes  Ekta Dancer Al-Sultani, MD [facility]  If 7PM-7AM, please contact night-coverage  11/11/2024, 6:53 AM      "

## 2024-11-11 NOTE — Progress Notes (Signed)
 2D Echocardiogram performed.

## 2024-11-11 NOTE — TOC Initial Note (Addendum)
 Transition of Care [facility]) - Initial/Assessment Note    Patient Details  Name: Julia Tucker MRN: [id number] Date of Birth: 1946-04-26  Transition of Care [facility]) CM/SW Contact:    Lendia Dais, LCSWA Phone Number: 11/11/2024, 2:14 PM  Clinical Narrative: Pt is from home w/ granddaughter. Pt states they are indep w/ ADL's, can drive, and have seen PCP in the last year. Pt has no legal HCPOA but was agreeable to a spiritual consult being placed for advanced directives. Consult placed.  Pt's source of income is SSI and states they are able to afford all basic needs.  Pt reports no DME or hx of STR or MH/SU. But reports a hx of HH.      No current TOC needs. Please place a TOC consult for any further needs.              Expected Discharge Plan: Home/Self Care Barriers to Discharge: Continued Medical Work up   Patient Goals and CMS Choice Patient states their goals for this hospitalization and ongoing recovery are:: Water aerobics          Expected Discharge Plan and Services In-house Referral: Clinical Social Work, Chaplain     Living arrangements for the past 2 months: Single Family Home                                      Prior Living Arrangements/Services Living arrangements for the past 2 months: Single Family Home Lives with:: Relatives Patient language and need for interpreter reviewed:: No Do you feel safe going back to the place where you live?: Yes      Need for Family Participation in Patient Care: No (Comment) Care giver support system in place?: No (comment)   Criminal Activity/Legal Involvement Pertinent to Current Situation/Hospitalization: No - Comment as needed  Activities of Daily Living   ADL Screening (condition at time of admission) Independently performs ADLs?: Yes (appropriate for developmental age) Is the patient deaf or have difficulty hearing?: No Does the patient have difficulty seeing, even when wearing glasses/contacts?: No Does  the patient have difficulty concentrating, remembering, or making decisions?: No  Permission Sought/Granted Permission sought to share information with : Family Supports Permission granted to share information with : Yes, Verbal Permission Granted  Share Information with NAME: Leighla Chestnutt     Permission granted to share info w Relationship: Daughter  Permission granted to share info w Contact Information: [phone]  Emotional Assessment Appearance:: Appears stated age Attitude/Demeanor/Rapport: Engaged Affect (typically observed): Appropriate Orientation: : Oriented to Self, Oriented to Situation, Oriented to Place, Oriented to  Time Alcohol / Substance Use: Not Applicable Psych Involvement: No (comment)  Admission diagnosis:  Influenza A [J10.1] Influenza [J11.1] Acute on chronic congestive heart failure, unspecified heart failure type (HCC) [I50.9] Patient Active Problem List   Diagnosis Date Noted   Influenza A 11/09/2024   Hypoxia 11/09/2024   Acute on chronic heart failure with preserved ejection fraction (HFpEF) (HCC) 11/09/2024   Cardiac amyloidosis (HCC) 11/09/2024   Hypokalemia 11/09/2024   Uncontrolled hypertension 12/28/2022   Stroke (cerebrum) (HCC) 12/28/2022   Chronic kidney disease, stage 3b (HCC) - baseline SCr 1.45 12/28/2022   Renal insufficiency 07/19/2013   Osteoarthritis of left knee 07/17/2013    Class: Diagnosis of   PCP:  Default, Provider, MD Pharmacy:   [id number] [address], [place] - [address] [place] [address] Phone: [phone] Fax: [phone]     Social Drivers of Health (SDOH) Social History: SDOH Screenings   Food Insecurity: No Food Insecurity (11/10/2024)  Housing: Low Risk (11/10/2024)  Transportation Needs: No Transportation Needs (11/10/2024)  Utilities: Not At Risk (11/10/2024)  Financial Resource Strain: Patient Declined (06/03/2024)   Received from [facility]  Physical Activity: Inactive (06/03/2024)   Received from [facility]  Social Connections: Socially Isolated (11/10/2024)  Stress: No Stress Concern Present (06/03/2024)   Received from [facility]  Tobacco Use: Medium Risk (11/09/2024)   SDOH Interventions:     Readmission Risk Interventions     No data to display

## 2024-11-11 NOTE — Progress Notes (Signed)
" °   11/11/24 1514  Spiritual Encounters  Type of Visit Initial  Care provided to: Patient  Conversation partners present during encounter Nurse  Referral source Nurse (RN/NT/LPN)  Reason for visit Advance directives  OnCall Visit No  Spiritual Framework  Presenting Themes Impactful experiences and emotions  Community/Connection Family  Patient Stress Factors Health changes  Family Stress Factors None identified  Interventions  Spiritual Care Interventions Made Established relationship of care and support;Compassionate presence;Normalization of emotions  Intervention Outcomes  Outcomes Awareness of support;Awareness of health   Chaplain provided education regarding Advance Directive (AD). PT initially expressed interested in completing a POA, believing the hospital offered a durable POA.  Chaplain clarified that the hospital facilitates healthcare POA documentation only. Pt shared her daughter would serve as her healthcare decision-maker if needed.   After discussion and reflection, Pt stated she did not wish to complete a healthcare POA at this time. Chaplain honored Pt's decision, offered support, and affirmed continued presence and accessibility for spiritual and emotional care as needs arise. "

## 2024-11-11 NOTE — Progress Notes (Signed)
 Heart Failure Navigator Progress Note  Assessed for Heart & Vascular TOC clinic readiness.  Patient does not meet criteria due to she is seen by [facility]. No HF TOC. .   Navigator will sign off at this time.   Stephane Haddock, BSN, Scientist, clinical (histocompatibility and immunogenetics) Only

## 2024-11-12 DIAGNOSIS — J101 Influenza due to other identified influenza virus with other respiratory manifestations: Secondary | ICD-10-CM | POA: Diagnosis not present

## 2024-11-12 LAB — BASIC METABOLIC PANEL WITH GFR
Anion gap: 10 (ref 5–15)
BUN: 26 mg/dL — ABNORMAL HIGH (ref 8–23)
CO2: 27 mmol/L (ref 22–32)
Calcium: 8.5 mg/dL — ABNORMAL LOW (ref 8.9–10.3)
Chloride: 105 mmol/L (ref 98–111)
Creatinine, Ser: 1.31 mg/dL — ABNORMAL HIGH (ref 0.44–1.00)
GFR, Estimated: 42 mL/min — ABNORMAL LOW
Glucose, Bld: 91 mg/dL (ref 70–99)
Potassium: 3.3 mmol/L — ABNORMAL LOW (ref 3.5–5.1)
Sodium: 141 mmol/L (ref 135–145)

## 2024-11-12 LAB — CBC
HCT: 38 % (ref 36.0–46.0)
Hemoglobin: 12.9 g/dL (ref 12.0–15.0)
MCH: 27.7 pg (ref 26.0–34.0)
MCHC: 33.9 g/dL (ref 30.0–36.0)
MCV: 81.7 fL (ref 80.0–100.0)
Platelets: 150 K/uL (ref 150–400)
RBC: 4.65 MIL/uL (ref 3.87–5.11)
RDW: 14.3 % (ref 11.5–15.5)
WBC: 2.8 K/uL — ABNORMAL LOW (ref 4.0–10.5)
nRBC: 0 % (ref 0.0–0.2)

## 2024-11-12 MED ORDER — OSELTAMIVIR PHOSPHATE 30 MG PO CAPS: 30.0000 mg | ORAL_CAPSULE | Freq: Two times a day (BID) | ORAL | 0 refills | Status: AC

## 2024-11-12 MED ORDER — AMLODIPINE BESYLATE 2.5 MG PO TABS: 5.0000 mg | ORAL_TABLET | Freq: Every day | ORAL | Status: AC

## 2024-11-12 NOTE — Progress Notes (Signed)
 DISCHARGE NOTE HOME Julia Tucker to be discharged Home per MD order. Charge RN has discussed prescriptions and follow up appointments with the patient. Prescriptions given to patient; medication list explained in detail. Patient verbalized understanding.  Skin clean, dry and intact without evidence of skin break down, no evidence of skin tears noted. IV catheter discontinued intact. Site without signs and symptoms of complications. Dressing and pressure applied. Pt denies pain at the site currently. No complaints noted.  Patient free of lines, drains, and wounds.   An After Visit Summary (AVS) was printed and given to the patient. Patient escorted via wheelchair to discharge lounge, she is awaiting ride (3pm ETA), and will be discharged home via private auto.  [name], RN

## 2024-11-12 NOTE — Plan of Care (Signed)

## 2024-11-12 NOTE — Plan of Care (Signed)
  Problem: Education: Goal: Ability to demonstrate management of disease process will improve Outcome: Progressing Goal: Ability to verbalize understanding of medication therapies will improve Outcome: Progressing   

## 2024-11-12 NOTE — TOC Transition Note (Signed)
 Transition of Tucker [facility]) - Discharge Note   Patient Details  Name: Julia Tucker MRN: [id number] Date of Birth: [birth year]  Transition of [facility]) CM/SW Contact:  Julia Tucker, Julia Rasor Daphne, RN Phone Number: 11/12/2024, 11:31 AM   Clinical Narrative:     Patient is scheduled for discharge today.  Readmission Risk Assessment done. Hospital f/u and discharge instructions on AVS. No ICM needs or recommendations noted. Grand daughter, Julia Tucker will transport at discharge.  No further ICM needs noted.       Final next level of Tucker: Home/Self Tucker Barriers to Discharge: Barriers Resolved   Patient Goals and CMS Choice Patient states their goals for this hospitalization and ongoing recovery are:: To return home CMS Medicare.gov Compare Post Acute Tucker list provided to:: Patient Choice offered to / list presented to : NA      Discharge Placement                Patient to be transferred to facility by: Grand daughter Name of family member notified: Julia Tucker    Discharge Plan and Services Additional resources added to the After Visit Summary for   In-house Referral: Clinical Social Work, Orthoptist              DME Arranged: N/A DME Agency: NA       HH Arranged: NA HH Agency: NA        Social Drivers of Health (SDOH) Interventions SDOH Screenings   Food Insecurity: No Food Insecurity (11/10/2024)  Housing: Low Risk (11/10/2024)  Transportation Needs: No Transportation Needs (11/10/2024)  Utilities: Not At Risk (11/10/2024)  Financial Resource Strain: Patient Declined (06/03/2024)   Received from [facility]  Physical Activity: Inactive (06/03/2024)   Received [facility]  Social Connections: Socially Isolated (11/10/2024)  Stress: No Stress Concern Present (06/03/2024)   Received from [facility]  Tobacco Use: Medium Risk (11/09/2024)     Readmission Risk Interventions    11/12/2024   11:30 AM  Readmission Risk Prevention Plan  Post Dischage Appt  Complete  Medication Screening Complete  Transportation Screening Complete

## 2024-11-15 NOTE — Discharge Summary (Signed)
 " Physician Discharge Summary   Patient: Julia Tucker MRN: [id number] DOB: 1946/03/30  Admit date:     11/09/2024  Discharge date: 11/12/2024  Discharge Physician: Julia Tucker   PCP: Default, Provider, MD   Recommendations at discharge:   Follow up with PCP within 1-2 weeks of discharge  Discharge Diagnoses: Principal Problem:   Influenza A Active Problems:   Acute on chronic heart failure with preserved ejection fraction (HFpEF) (HCC)   Cardiac amyloidosis (HCC)   Hypokalemia  Resolved Problems:   Hypoxia   Hospital Course: Patient is a 79 year old female with PMHx of HFpEF, infiltrative cardiomyopathy with cardiac amyloid, paroxysmal SVT, CKD stage IIIb, gout, CVA, hepatitis C, HTN, HLD, persistent right pleural effusion, chronic venous insufficiency, who presented to the the ED on 11/09/2024 from [facility] urgent care for flulike symptoms, positive influenza A, and hypoxia. The patient was experiencing a couple days of URI symptoms with myalgias, fatigue, fever, chills, and multiple episodes of NBNB emesis.   At urgent care, patient was noted to be febrile to 103.5 F, with SpO2 91-93% on RA with drop to 89% with ambulation, and positive for influenza A.  The patient was given Tylenol  and Zofran  prior to arrival to the ED.  In the ED SpO2 was noted to be 89 to 93% on RA and the patient was placed on 2 L [place].  She was febrile to 104 F.  No significant abnormalities on CBC.  BMP notable for K3.4, creatinine 1.2.  proBNP elevated to 11,000.  CXR showed mildly increased right basilar opacity concerning for increased atelectasis or scarring with possible small right pleural effusion.  She was treated with Tylenol , IV Lasix  60 mg, DuoNeb, and Tamiflu  in the ED.  She was admitted for influenza A infection with concern for acute on chronic HFpEF.   Assessment and Plan:   # Influenza A infection # Mild hypoxia - Presented with 2 days of URI symptoms, malaise, fevers, N/V. Positive for influenza  A at urgent care - CXR without concern for concomitant pneumonia - Maintained SpO2 > 90 % on RA - Afebrile overnight  - Discharged with prescription for Tamiflu  to complete 5 day course   # Acute on chronic HFpEF  # Infiltrative cardiomyopathy with cardiac amyloidosis - Follows at outpatient HF clinic - Reports compliance with all medications except the day of her presentation  - Pro BNP elevated to 11,000 but patient not overtly volume overload on exam - Received IV Lasix  60 mg in the ED - Echo showed LVEF 60-65%, no RWMA, severe concentric LVH, G2DD, flattened septum c/w volume overload, mildly reduced RV function, normal PASP, moderate biatrial dilation, small circumferential pericardial effusion without tamponade   - Strict I&Os, daily weights - Continue home Coreg  6.25 mg BID, Losartan  100 mg daily, Tafamidis  61 mg daily - Continue home Bumex  2 mg BID   # CKD stage IIIb - Cr 1.20 on admission. Review of outside records shows recent Cr 1.29 - 1.58 - Cr up to 1.41 today, within recent baseline creatinine - Continue to monitor  # HTN - Continue home amlodipine , losartan , Coreg    #Gout - Continue home allopurinol  300 mg daily   # CVA - Continue ASA and Crestor    # HLD - Continue Crestor         Consultants: None Procedures performed: None  Disposition: Home Diet recommendation:   DISCHARGE MEDICATION: Allergies as of 11/12/2024       Reactions   Clonidine    Drowsiness  Triamcinolone Acetonide Rash   Prinivil [lisinopril] Swelling        Medication List     TAKE these medications    allopurinol  300 MG tablet Commonly known as: ZYLOPRIM  Take 300 mg by mouth daily.   amLODipine  2.5 MG tablet Commonly known as: NORVASC  Take 2 tablets (5 mg total) by mouth daily.   aspirin  EC 81 MG tablet Take 81 mg by mouth daily. Swallow whole.   bumetanide  2 MG tablet Commonly known as: BUMEX  Take 2 mg by mouth 2 (two) times daily.   carvedilol  6.25 MG  tablet Commonly known as: COREG  Take 6.25 mg by mouth 2 (two) times daily with a meal.   losartan  100 MG tablet Commonly known as: COZAAR  Take 100 mg by mouth daily.   methocarbamol  500 MG tablet Commonly known as: ROBAXIN  Take 500 mg by mouth daily as needed for muscle spasms.   potassium chloride  10 MEQ tablet Commonly known as: KLOR-CON  Take 20 mEq by mouth daily.   rosuvastatin  10 MG tablet Commonly known as: CRESTOR  Take 10 mg by mouth at bedtime.   Tafamidis  61 MG Caps Take 61 mg by mouth daily.   VITAMIN A PO Take 2,400 mcg by mouth daily.       ASK your doctor about these medications    oseltamivir  30 MG capsule Commonly known as: TAMIFLU  Take 1 capsule (30 mg total) by mouth 2 (two) times daily for 3 doses. Ask about: Should I take this medication?         Discharge Exam: Filed Weights   11/09/24 1253 11/11/24 0500 11/12/24 0500  Weight: 104.8 kg 102.7 kg 101.9 kg   Blood pressure 128/65, pulse (!) 53, temperature 97.6 F (36.4 C), temperature source Oral, resp. rate 18, height 5' 7 (1.702 m), weight 101.9 kg, SpO2 95%.   Gen: NAD, A&Ox3 HEENT: NCAT Neck: Supple, no JVD CV: RRR, no murmurs Resp: normal WOB, mild right basilar rales, otherwise clear to auscultation Abd: Soft, NTND, no guarding Ext: Trace LE edema to mid shin, pulses 2+ b/l Skin: Warm, dry Neuro: No focal deficits Psych: Calm, cooperative, appropriate affect  Condition at discharge: good  The results of significant diagnostics from this hospitalization (including imaging, microbiology, ancillary and laboratory) are listed below for reference.   Imaging Studies: ECHOCARDIOGRAM COMPLETE Result Date: 11/11/2024    ECHOCARDIOGRAM REPORT   Patient Name:   Julia Tucker Date of Exam: 11/11/2024 Medical Rec #:  [id number]       Height:       67.0 in Accession #:    [id number]      Weight:       226.4 lb Date of Birth:  December 04, 1945        BSA:          2.132 m Patient Age:    78 years         BP:           121/74 mmHg Patient Gender: F               HR:           56 bpm. Exam Location:  Inpatient Procedure: 2D Echo, Cardiac Doppler, Color Doppler and Intracardiac            Opacification Agent (Both Spectral and Color Flow Doppler were            utilized during procedure). Indications:    CHF  History:  Patient has no prior history of Echocardiogram examinations.  Sonographer:    Carmelita Hartshorn RDCS, FE, PE Referring Phys: [id number] VASUNDHRA RATHORE IMPRESSIONS  1. Left ventricular ejection fraction, by estimation, is 60 to 65%. The left ventricle has normal function. The left ventricle has no regional wall motion abnormalities. There is severe concentric left ventricular hypertrophy. Left ventricular diastolic  parameters are consistent with Grade II diastolic dysfunction (pseudonormalization). There is the interventricular septum is flattened in systole and diastole, consistent with right ventricular pressure and volume overload.  2. Right ventricular systolic function is mildly reduced. The right ventricular size is normal. Mildly increased right ventricular wall thickness. There is normal pulmonary artery systolic pressure. The estimated right ventricular systolic pressure is 28.6 mmHg.  3. Left atrial size was moderately dilated.  4. Right atrial size was moderately dilated.  5. A small pericardial effusion is present. The pericardial effusion is circumferential. There is no evidence of cardiac tamponade.  6. The mitral valve is degenerative. Mild mitral valve regurgitation. No evidence of mitral stenosis.  7. The aortic valve is tricuspid. There is mild calcification of the aortic valve. There is moderate thickening of the aortic valve. Aortic valve regurgitation is not visualized. No aortic stenosis is present.  8. The inferior vena cava is normal in size with greater than 50% respiratory variability, suggesting right atrial pressure of 3 mmHg. Conclusion(s)/Recommendation(s): Cardiac  amyloid. FINDINGS  Left Ventricle: Left ventricular ejection fraction, by estimation, is 60 to 65%. The left ventricle has normal function. The left ventricle has no regional wall motion abnormalities. Definity  contrast agent was given IV to delineate the left ventricular  endocardial borders. The left ventricular internal cavity size was normal in size. There is severe concentric left ventricular hypertrophy. The interventricular septum is flattened in systole and diastole, consistent with right ventricular pressure and volume overload. Left ventricular diastolic parameters are consistent with Grade II diastolic dysfunction (pseudonormalization). Right Ventricle: The right ventricular size is normal. Mildly increased right ventricular wall thickness. Right ventricular systolic function is mildly reduced. There is normal pulmonary artery systolic pressure. The tricuspid regurgitant velocity is 2.53 m/s, and with an assumed right atrial pressure of 3 mmHg, the estimated right ventricular systolic pressure is 28.6 mmHg. Left Atrium: Left atrial size was moderately dilated. Right Atrium: Right atrial size was moderately dilated. Pericardium: A small pericardial effusion is present. The pericardial effusion is circumferential. There is no evidence of cardiac tamponade. Mitral Valve: The mitral valve is degenerative in appearance. Mild mitral valve regurgitation. No evidence of mitral valve stenosis. Tricuspid Valve: The tricuspid valve is normal in structure. Tricuspid valve regurgitation is trivial. No evidence of tricuspid stenosis. Aortic Valve: The aortic valve is tricuspid. There is mild calcification of the aortic valve. There is moderate thickening of the aortic valve. Aortic valve regurgitation is not visualized. No aortic stenosis is present. Pulmonic Valve: The pulmonic valve was normal in structure. Pulmonic valve regurgitation is trivial. No evidence of pulmonic stenosis. Aorta: The aortic root is normal in  size and structure. Venous: The inferior vena cava is normal in size with greater than 50% respiratory variability, suggesting right atrial pressure of 3 mmHg. IAS/Shunts: No atrial level shunt detected by color flow Doppler.  LEFT VENTRICLE PLAX 2D LVIDd:         3.90 cm   Diastology LVIDs:         2.40 cm   LV e' medial:    2.92 cm/s LV PW:  1.80 cm   LV E/e' medial:  36.6 LV IVS:        1.90 cm   LV e' lateral:   3.65 cm/s LVOT diam:     2.30 cm   LV E/e' lateral: 29.3 LV SV:         101 LV SV Index:   47 LVOT Area:     4.15 cm  RIGHT VENTRICLE RV Basal diam:  3.50 cm RV Mid diam:    2.40 cm RV S prime:     7.30 cm/s TAPSE (M-mode): 1.4 cm LEFT ATRIUM             Index        RIGHT ATRIUM           Index LA diam:        3.90 cm 1.83 cm/m   RA Area:     24.40 cm LA Vol (A2C):   93.8 ml 44.00 ml/m  RA Volume:   78.00 ml  36.59 ml/m LA Vol (A4C):   86.7 ml 40.67 ml/m LA Biplane Vol: 92.9 ml 43.58 ml/m  AORTIC VALVE             PULMONIC VALVE LVOT Vmax:   116.00 cm/s PR End Diast Vel: 8.64 msec LVOT Vmean:  74.400 cm/s LVOT VTI:    0.243 m  AORTA Ao Root diam: 3.00 cm Ao Asc diam:  3.50 cm MITRAL VALVE                TRICUSPID VALVE MV Area (PHT): 3.27 cm     TR Peak grad:   25.6 mmHg MV Decel Time: 232 msec     TR Vmax:        253.00 cm/s MV E velocity: 107.00 cm/s MV A velocity: 69.00 cm/s   SHUNTS MV E/A ratio:  1.55         Systemic VTI:  0.24 m                             Systemic Diam: 2.30 cm Oneil Parchment MD Electronically signed by Oneil Parchment MD Signature Date/Time: 11/11/2024/1:52:29 PM    Final    DG Chest 2 View Result Date: 11/09/2024 CLINICAL DATA:  Cough, fever EXAM: CHEST - 2 VIEW COMPARISON:  August 12, 2012 FINDINGS: Stable cardiomegaly. Stable left perihilar scarring. Mildly increased right basilar opacity is noted concerning for possible increased atelectasis or scarring with possible small right pleural effusion. Bony thorax is unremarkable. IMPRESSION: Mildly increased right  basilar opacity is noted concerning for possible increased atelectasis or scarring with possible small right pleural effusion. Electronically Signed   By: Lynwood Landy Raddle M.D.   On: 11/09/2024 15:00    Microbiology: Results for orders placed or performed during the hospital encounter of 07/11/13  Surgical pcr screen     Status: None   Collection Time: 07/11/13 10:09 AM   Specimen: Nasal Mucosa; Nasal Swab  Result Value Ref Range Status   MRSA, PCR NEGATIVE NEGATIVE Final   Staphylococcus aureus NEGATIVE NEGATIVE Final    Comment:        The Xpert SA Assay (FDA approved for NASAL specimens in patients over 21 years of age), is one component of a comprehensive surveillance program.  Test performance has been validated by [employer] for patients greater than or equal to 82 year old. It is not intended to diagnose infection nor to guide or monitor treatment.  Labs: CBC: Recent Labs  Lab 11/09/24 1329 11/11/24 0413 11/12/24 0411  WBC 6.8 2.9* 2.8*  NEUTROABS 5.5  --   --   HGB 13.5 12.9 12.9  HCT 39.7 37.6 38.0  MCV 82.9 82.6 81.7  PLT 166 126* 150   Basic Metabolic Panel: Recent Labs  Lab 11/09/24 1329 11/10/24 0052 11/10/24 1506 11/11/24 0413 11/12/24 0411  NA 137 140  --  139 141  K 3.4* 3.3*  --  3.7 3.3*  CL 104 106  --  105 105  CO2 23 25  --  26 27  GLUCOSE 97 91  --  79 91  BUN 18 21  --  26* 26*  CREATININE 1.20* 1.49* 1.37* 1.41* 1.31*  CALCIUM  9.0 8.5*  --  8.3* 8.5*  MG  --  2.0  --   --   --    Liver Function Tests: Recent Labs  Lab 11/10/24 0052  AST 38  ALT 14  ALKPHOS 135*  BILITOT 0.8  PROT 6.8  ALBUMIN 3.3*   CBG: No results for input(s): GLUCAP in the last 168 hours.  Discharge time spent: Time Coordinating Discharge: I spent a total of 35 minutes engaged in face-to-face discussion with the patient and/or caregivers regarding the patients care, assessment, plan, and discharge disposition. Over 50% of this time was dedicated  to counseling the patient on the risks and benefits of treatment options and the discharge plan, as well as coordinating post-discharge care.   Signed: Cordella Nyquist Al-Sultani, MD [facility] 11/15/2024         "
